# Patient Record
Sex: Female | Born: 1940
Health system: Southern US, Community
[De-identification: ages and names within clinical notes are randomized; demographics above are authoritative.]

## PROBLEM LIST (undated history)

## (undated) DIAGNOSIS — M810 Age-related osteoporosis without current pathological fracture: Secondary | ICD-10-CM

## (undated) DIAGNOSIS — I10 Essential (primary) hypertension: Secondary | ICD-10-CM

## (undated) DIAGNOSIS — J302 Other seasonal allergic rhinitis: Secondary | ICD-10-CM

## (undated) DIAGNOSIS — R002 Palpitations: Secondary | ICD-10-CM

## (undated) DIAGNOSIS — M1990 Unspecified osteoarthritis, unspecified site: Secondary | ICD-10-CM

## (undated) DIAGNOSIS — H269 Unspecified cataract: Secondary | ICD-10-CM

## (undated) DIAGNOSIS — F329 Major depressive disorder, single episode, unspecified: Secondary | ICD-10-CM

## (undated) DIAGNOSIS — E7800 Pure hypercholesterolemia, unspecified: Secondary | ICD-10-CM

## (undated) DIAGNOSIS — F32A Depression, unspecified: Secondary | ICD-10-CM

## (undated) DIAGNOSIS — F419 Anxiety disorder, unspecified: Secondary | ICD-10-CM

## (undated) HISTORY — DX: Palpitations: R00.2

## (undated) HISTORY — PX: BREAST SURGERY: SHX581

## (undated) HISTORY — PX: ABDOMINAL HYSTERECTOMY: SHX81

## (undated) HISTORY — DX: Essential (primary) hypertension: I10

## (undated) HISTORY — DX: Depression, unspecified: F32.A

## (undated) HISTORY — PX: CHOLECYSTECTOMY: SHX55

## (undated) HISTORY — DX: Age-related osteoporosis without current pathological fracture: M81.0

## (undated) HISTORY — DX: Major depressive disorder, single episode, unspecified: F32.9

## (undated) HISTORY — DX: Other seasonal allergic rhinitis: J30.2

## (undated) HISTORY — DX: Unspecified cataract: H26.9

## (undated) HISTORY — DX: Pure hypercholesterolemia, unspecified: E78.00

---

## 1998-01-01 ENCOUNTER — Ambulatory Visit (HOSPITAL_COMMUNITY): Admission: RE | Admit: 1998-01-01 | Discharge: 1998-01-01 | Payer: Self-pay | Admitting: Obstetrics and Gynecology

## 1998-01-08 ENCOUNTER — Ambulatory Visit (HOSPITAL_COMMUNITY): Admission: RE | Admit: 1998-01-08 | Discharge: 1998-01-08 | Payer: Self-pay | Admitting: Obstetrics and Gynecology

## 1998-05-29 ENCOUNTER — Other Ambulatory Visit: Admission: RE | Admit: 1998-05-29 | Discharge: 1998-05-29 | Payer: Self-pay | Admitting: Gastroenterology

## 1998-11-19 ENCOUNTER — Other Ambulatory Visit: Admission: RE | Admit: 1998-11-19 | Discharge: 1998-11-19 | Payer: Self-pay | Admitting: Obstetrics and Gynecology

## 1999-01-12 ENCOUNTER — Encounter: Payer: Self-pay | Admitting: Obstetrics and Gynecology

## 1999-01-12 ENCOUNTER — Ambulatory Visit (HOSPITAL_COMMUNITY): Admission: RE | Admit: 1999-01-12 | Discharge: 1999-01-12 | Payer: Self-pay | Admitting: Obstetrics and Gynecology

## 1999-09-10 ENCOUNTER — Ambulatory Visit (HOSPITAL_COMMUNITY): Admission: RE | Admit: 1999-09-10 | Discharge: 1999-09-10 | Payer: Self-pay | Admitting: Internal Medicine

## 1999-09-10 ENCOUNTER — Encounter: Payer: Self-pay | Admitting: Internal Medicine

## 2000-01-13 ENCOUNTER — Encounter: Payer: Self-pay | Admitting: Internal Medicine

## 2000-01-13 ENCOUNTER — Ambulatory Visit (HOSPITAL_COMMUNITY): Admission: RE | Admit: 2000-01-13 | Discharge: 2000-01-13 | Payer: Self-pay | Admitting: Internal Medicine

## 2000-02-01 ENCOUNTER — Other Ambulatory Visit: Admission: RE | Admit: 2000-02-01 | Discharge: 2000-02-01 | Payer: Self-pay | Admitting: Obstetrics and Gynecology

## 2001-01-13 ENCOUNTER — Encounter: Payer: Self-pay | Admitting: Obstetrics and Gynecology

## 2001-01-13 ENCOUNTER — Ambulatory Visit (HOSPITAL_COMMUNITY): Admission: RE | Admit: 2001-01-13 | Discharge: 2001-01-13 | Payer: Self-pay | Admitting: Obstetrics and Gynecology

## 2001-02-22 ENCOUNTER — Other Ambulatory Visit: Admission: RE | Admit: 2001-02-22 | Discharge: 2001-02-22 | Payer: Self-pay | Admitting: Obstetrics and Gynecology

## 2001-10-18 ENCOUNTER — Encounter: Payer: Self-pay | Admitting: Surgery

## 2001-10-23 ENCOUNTER — Encounter (INDEPENDENT_AMBULATORY_CARE_PROVIDER_SITE_OTHER): Payer: Self-pay

## 2001-10-23 ENCOUNTER — Inpatient Hospital Stay (HOSPITAL_COMMUNITY): Admission: RE | Admit: 2001-10-23 | Discharge: 2001-10-26 | Payer: Self-pay | Admitting: Surgery

## 2002-03-08 ENCOUNTER — Ambulatory Visit (HOSPITAL_COMMUNITY): Admission: RE | Admit: 2002-03-08 | Discharge: 2002-03-08 | Payer: Self-pay | Admitting: Internal Medicine

## 2002-03-08 ENCOUNTER — Encounter: Payer: Self-pay | Admitting: Internal Medicine

## 2003-03-13 ENCOUNTER — Ambulatory Visit (HOSPITAL_COMMUNITY): Admission: RE | Admit: 2003-03-13 | Discharge: 2003-03-13 | Payer: Self-pay | Admitting: Internal Medicine

## 2003-03-21 ENCOUNTER — Encounter: Admission: RE | Admit: 2003-03-21 | Discharge: 2003-03-21 | Payer: Self-pay | Admitting: Internal Medicine

## 2004-03-26 ENCOUNTER — Ambulatory Visit: Payer: Self-pay | Admitting: Internal Medicine

## 2004-04-15 ENCOUNTER — Ambulatory Visit: Payer: Self-pay | Admitting: Internal Medicine

## 2004-04-17 ENCOUNTER — Encounter: Admission: RE | Admit: 2004-04-17 | Discharge: 2004-04-17 | Payer: Self-pay | Admitting: Internal Medicine

## 2004-04-18 ENCOUNTER — Ambulatory Visit: Payer: Self-pay | Admitting: Internal Medicine

## 2004-11-06 ENCOUNTER — Ambulatory Visit: Payer: Self-pay | Admitting: Internal Medicine

## 2005-05-05 ENCOUNTER — Encounter: Admission: RE | Admit: 2005-05-05 | Discharge: 2005-05-05 | Payer: Self-pay | Admitting: Internal Medicine

## 2005-05-20 ENCOUNTER — Ambulatory Visit: Payer: Self-pay | Admitting: Internal Medicine

## 2005-05-24 ENCOUNTER — Ambulatory Visit: Payer: Self-pay | Admitting: Internal Medicine

## 2005-06-10 ENCOUNTER — Ambulatory Visit: Payer: Self-pay | Admitting: Internal Medicine

## 2005-07-01 ENCOUNTER — Ambulatory Visit: Payer: Self-pay | Admitting: Internal Medicine

## 2005-07-27 ENCOUNTER — Ambulatory Visit: Payer: Self-pay | Admitting: Internal Medicine

## 2005-08-03 ENCOUNTER — Ambulatory Visit: Payer: Self-pay | Admitting: Internal Medicine

## 2006-04-29 ENCOUNTER — Ambulatory Visit: Payer: Self-pay | Admitting: Internal Medicine

## 2006-05-11 ENCOUNTER — Encounter: Admission: RE | Admit: 2006-05-11 | Discharge: 2006-05-11 | Payer: Self-pay | Admitting: Internal Medicine

## 2006-05-20 ENCOUNTER — Encounter: Admission: RE | Admit: 2006-05-20 | Discharge: 2006-05-20 | Payer: Self-pay | Admitting: Internal Medicine

## 2006-05-30 ENCOUNTER — Ambulatory Visit: Payer: Self-pay | Admitting: Internal Medicine

## 2006-05-30 LAB — CONVERTED CEMR LAB
ALT: 25 units/L (ref 0–40)
AST: 24 units/L (ref 0–37)
Albumin: 4 g/dL (ref 3.5–5.2)
Alkaline Phosphatase: 75 units/L (ref 39–117)
BUN: 11 mg/dL (ref 6–23)
Bilirubin, Direct: 0.1 mg/dL (ref 0.0–0.3)
Chol/HDL Ratio, serum: 3.2
Cholesterol: 181 mg/dL (ref 0–200)
Creatinine, Ser: 0.7 mg/dL (ref 0.4–1.2)
Glucose, Bld: 108 mg/dL — ABNORMAL HIGH (ref 70–99)
HDL: 56.3 mg/dL (ref >39.0)
LDL Cholesterol: 93 mg/dL (ref 0–99)
Potassium: 4.4 meq/L (ref 3.5–5.1)
TSH: 3 microintl units/mL (ref 0.35–5.50)
Total Bilirubin: 0.8 mg/dL (ref 0.3–1.2)
Total Protein: 7.1 g/dL (ref 6.0–8.3)
Triglyceride fasting, serum: 159 mg/dL — ABNORMAL HIGH (ref 0–149)
VLDL: 32 mg/dL (ref 0–40)

## 2006-06-10 ENCOUNTER — Encounter: Admission: RE | Admit: 2006-06-10 | Discharge: 2006-06-10 | Payer: Self-pay | Admitting: Surgery

## 2006-07-05 ENCOUNTER — Encounter (INDEPENDENT_AMBULATORY_CARE_PROVIDER_SITE_OTHER): Payer: Self-pay | Admitting: *Deleted

## 2006-07-05 ENCOUNTER — Ambulatory Visit (HOSPITAL_BASED_OUTPATIENT_CLINIC_OR_DEPARTMENT_OTHER): Admission: RE | Admit: 2006-07-05 | Discharge: 2006-07-05 | Payer: Self-pay | Admitting: Surgery

## 2006-07-05 ENCOUNTER — Encounter: Admission: RE | Admit: 2006-07-05 | Discharge: 2006-07-05 | Payer: Self-pay | Admitting: Surgery

## 2006-07-18 ENCOUNTER — Inpatient Hospital Stay (HOSPITAL_COMMUNITY): Admission: EM | Admit: 2006-07-18 | Discharge: 2006-07-22 | Payer: Self-pay | Admitting: Emergency Medicine

## 2006-07-18 ENCOUNTER — Ambulatory Visit: Payer: Self-pay | Admitting: Internal Medicine

## 2006-07-21 ENCOUNTER — Encounter (INDEPENDENT_AMBULATORY_CARE_PROVIDER_SITE_OTHER): Payer: Self-pay | Admitting: *Deleted

## 2006-07-21 ENCOUNTER — Ambulatory Visit: Payer: Self-pay | Admitting: Gastroenterology

## 2006-10-11 ENCOUNTER — Ambulatory Visit: Payer: Self-pay | Admitting: Gastroenterology

## 2006-10-20 ENCOUNTER — Encounter: Payer: Self-pay | Admitting: Gastroenterology

## 2006-10-20 ENCOUNTER — Ambulatory Visit: Payer: Self-pay | Admitting: Gastroenterology

## 2007-05-25 ENCOUNTER — Encounter: Admission: RE | Admit: 2007-05-25 | Discharge: 2007-05-25 | Payer: Self-pay | Admitting: Internal Medicine

## 2007-09-05 ENCOUNTER — Encounter: Payer: Self-pay | Admitting: Internal Medicine

## 2007-11-08 ENCOUNTER — Other Ambulatory Visit: Admission: RE | Admit: 2007-11-08 | Discharge: 2007-11-08 | Payer: Self-pay | Admitting: Obstetrics and Gynecology

## 2008-06-03 ENCOUNTER — Encounter: Admission: RE | Admit: 2008-06-03 | Discharge: 2008-06-03 | Payer: Self-pay | Admitting: Internal Medicine

## 2009-06-12 ENCOUNTER — Encounter: Admission: RE | Admit: 2009-06-12 | Discharge: 2009-06-12 | Payer: Self-pay | Admitting: Internal Medicine

## 2009-09-26 ENCOUNTER — Encounter (INDEPENDENT_AMBULATORY_CARE_PROVIDER_SITE_OTHER): Payer: Self-pay | Admitting: *Deleted

## 2009-12-05 ENCOUNTER — Encounter (INDEPENDENT_AMBULATORY_CARE_PROVIDER_SITE_OTHER): Payer: Self-pay | Admitting: *Deleted

## 2009-12-08 ENCOUNTER — Ambulatory Visit: Payer: Self-pay | Admitting: Gastroenterology

## 2009-12-23 ENCOUNTER — Ambulatory Visit: Payer: Self-pay | Admitting: Gastroenterology

## 2010-06-15 ENCOUNTER — Encounter
Admission: RE | Admit: 2010-06-15 | Discharge: 2010-06-15 | Payer: Self-pay | Source: Home / Self Care | Attending: Internal Medicine | Admitting: Internal Medicine

## 2010-06-16 NOTE — Letter (Signed)
Summary: Colonoscopy Letter  [facility]  [address] [place], [place] [address]   Phone: [phone]  Fax: [phone]      Sep 26, 2009 MRN: [id number]   [facility] [address] [place], [place]  [address]   Dear Ms. Stuckey,   According to your medical record, it is time for you to schedule a Colonoscopy. The American Cancer Society recommends this procedure as a method to detect early colon cancer. Patients with a family history of colon cancer, or a personal history of colon polyps or inflammatory bowel disease are at increased risk.  This letter has beeen generated based on the recommendations made at the time of your procedure. If you feel that in your particular situation this may no longer apply, please contact our office.  Please call our office at [phone] to schedule this appointment or to update your records at your earliest convenience.  Thank you for cooperating with [place] to provide you with the very best care possible.   Sincerely,  Judie Petit T. Russella Dar, M.D.  [facility] [phone]

## 2010-06-16 NOTE — Miscellaneous (Signed)
Summary: recall col ...em  Clinical Lists Changes  Allergies: Added new allergy or adverse reaction of SULFA Added new allergy or adverse reaction of * TYLENOL/ADVIL Observations: Added new observation of NKA: F (12/08/2009 10:37)  Appended Document: recall col ...em    Clinical Lists Changes  Medications: Added new medication of MOVIPREP 100 GM  SOLR (PEG-KCL-NACL-NASULF-NA ASC-C) As directed - Signed Rx of MOVIPREP 100 GM  SOLR (PEG-KCL-NACL-NASULF-NA ASC-C) As directed;  #1 x 0;  Signed;  Entered by: Clide Cliff RN;  Authorized by: Meryl Dare MD [facility];  Method used: [facility]  [place] [address], [place], [place]  [address], Ph: [phone] or [phone], Fax: [phone]    Prescriptions: MOVIPREP 100 GM  SOLR (PEG-KCL-NACL-NASULF-NA ASC-C) As directed  #1 x 0   Entered by:   Clide Cliff RN   Authorized by:   Meryl Dare MD [facility]   Signed by:   Clide Cliff RN on 12/08/2009   Method used:   [facility]ron[facility] to        [facility]  [place] [address] (retail)       [address]       [place], [place]  [address]       Ph: [phone] or [phone]       Fax: [phone]   RxID:   [id number]

## 2010-06-16 NOTE — Procedures (Signed)
Summary: Colonoscopy  Patient: Cristina Frederick Note: All result statuses are Final unless otherwise noted.  Tests: (1) Colonoscopy (COL)   COL Colonoscopy           DONE (C)     [facility]     [address]     [place], [place]  [address]           COLONOSCOPY PROCEDURE REPORT           PATIENT:  Cristina, Frederick  MR#:  [id number]     BIRTHDATE:  10-30-1940, 69 yrs. old  GENDER:  female     ENDOSCOPIST:  Judie Petit T. Russella Dar, MD, [facility]           PROCEDURE DATE:  12/23/2009     PROCEDURE:  High risk screening colonoscopy G105     ASA CLASS:  Class II     INDICATIONS:  1) surveillance and high-risk screening  2)     follow-up of polyp, tubulovillous adenoma, 10/2001.     MEDICATIONS:   Fentanyl 75 mcg IV, Versed 8 mg IV     DESCRIPTION OF PROCEDURE:   After the risks benefits and     alternatives of the procedure were thoroughly explained, informed     consent was obtained.  Digital rectal exam was performed and     revealed no abnormalities.   The LB PCF-H180AL [id number] endoscope     was introduced through the anus and advanced to the anastomosis,     without limitations.  The quality of the prep was good, using     MoviPrep.  The instrument was then slowly withdrawn as the colon     was fully examined.     <<PROCEDUREIMAGES>>     FINDINGS:  The right colon was surgically resected and an     ileo-colonic anastamosis was seen.  Moderate diverticulosis was     found in the sigmoid to descending colon. This was otherwise a     normal examination of the colon. Retroflexed views in the rectum     revealed internal hemorrhoids, small.  The time to cecum =  3.33     minutes. The scope was then withdrawn (time =  8.67  min) from the     patient and the procedure completed.           COMPLICATIONS:  None           ENDOSCOPIC IMPRESSION:     1) Prior right hemi-colectomy     2) Moderate diverticulosis in the sigmoid to descending colon     3) Internal hemorrhoids        RECOMMENDATIONS:     1) High fiber diet with liberal fluid intake.     2) Repeat Colonoscopy in 5 years.           Venita Lick. Russella Dar, MD, [facility]           CC: Renford Dills MD           n.     REVISED:  12/23/2009 09:47 AM     eSIGNED:   Judie Petit T. Wetzel Meester at 12/23/2009 09:47 AM           [name], [id number]  Note: An exclamation mark (!) indicates a result that was not dispersed into the flowsheet. Document Creation Date: 12/23/2009 9:47 AM _______________________________________________________________________  (1) Order result status: Final Collection or observation date-time: 12/23/2009 09:34 Requested date-time:  Receipt date-time:  Reported date-time:  Referring Physician:  Ordering Physician: Claudette Head [phone]) Specimen Source:  Source: Launa Grill Order Number: [id number] Lab site:   Appended Document: Colonoscopy    Clinical Lists Changes  Observations: Added new observation of COLONNXTDUE: 12/2014 (12/23/2009 11:15)

## 2010-06-16 NOTE — Letter (Signed)
Summary: [facility] Instructions  [facility]  [address] [place], [place] [address]   Phone: [phone]  Fax: [phone]       Cristina Frederick    1940/06/03    MRN: [id number]        Procedure Day /Date:  12/23/09  Tuesday     Arrival Time:  8:00am     Procedure Time: 9:00am     Location of Procedure:                    _x _  [facility] (4th Floor)                        PREPARATION FOR COLONOSCOPY WITH MOVIPREP   Starting 5 days prior to your procedure _ 8/4/11_ do not eat nuts, seeds, popcorn, corn, beans, peas,  salads, or any raw vegetables.  Do not take any fiber supplements (e.g. Metamucil, Citrucel, and Benefiber).  THE DAY BEFORE YOUR PROCEDURE         DATE:   12/22/09   DAY:  Monday  1.  Drink clear liquids the entire day-NO SOLID FOOD  2.  Do not drink anything colored red or purple.  Avoid juices with pulp.  No orange juice.  3.  Drink at least 64 oz. (8 glasses) of fluid/clear liquids during the day to prevent dehydration and help the prep work efficiently.  CLEAR LIQUIDS INCLUDE: Water Jello Ice Popsicles Tea (sugar ok, no milk/cream) Powdered fruit flavored drinks Coffee (sugar ok, no milk/cream) Gatorade Juice: apple, white grape, white cranberry  Lemonade Clear bullion, consomm, broth Carbonated beverages (any kind) Strained chicken noodle soup Hard Candy                             4.  In the morning, mix first dose of MoviPrep solution:    Empty 1 Pouch A and 1 Pouch B into the disposable container    Add lukewarm drinking water to the top line of the container. Mix to dissolve    Refrigerate (mixed solution should be used within 24 hrs)  5.  Begin drinking the prep at 5:00 p.m. The MoviPrep container is divided by 4 marks.   Every 15 minutes drink the solution down to the next mark (approximately 8 oz) until the full liter is complete.   6.  Follow completed prep with 16 oz of clear liquid of your choice  (Nothing red or purple).  Continue to drink clear liquids until bedtime.  7.  Before going to bed, mix second dose of MoviPrep solution:    Empty 1 Pouch A and 1 Pouch B into the disposable container    Add lukewarm drinking water to the top line of the container. Mix to dissolve    Refrigerate  THE DAY OF YOUR PROCEDURE      DATE:  12/23/09  DAY:  Tuesday  Beginning at 4:00 a.m. (5 hours before procedure):         1. Every 15 minutes, drink the solution down to the next mark (approx 8 oz) until the full liter is complete.  2. Follow completed prep with 16 oz. of clear liquid of your choice.    3. You may drink clear liquids until  7:00am  (2 HOURS BEFORE PROCEDURE).   MEDICATION INSTRUCTIONS  Unless otherwise instructed, you should take regular prescription medications with a small sip of  water   as early as possible the morning of your procedure.    Additional medication instructions:   Do not take your HCTZ the am of your procedure         OTHER INSTRUCTIONS  You will need a responsible adult at least 72 years of age to accompany you and drive you home.   This person must remain in the waiting room during your procedure.  Wear loose fitting clothing that is easily removed.  Leave jewelry and other valuables at home.  However, you may wish to bring a book to read or  an iPod/MP3 player to listen to music as you wait for your procedure to start.  Remove all body piercing jewelry and leave at home.  Total time from sign-in until discharge is approximately 2-3 hours.  You should go home directly after your procedure and rest.  You can resume normal activities the  day after your procedure.  The day of your procedure you should not:   Drive   Make legal decisions   Operate machinery   Drink alcohol   Return to work  You will receive specific instructions about eating, activities and medications before you leave.    The above instructions have been  reviewed and explained to me by   Clide Cliff, RN______________________    I fully understand and can verbalize these instructions _____________________________ Date _________

## 2010-10-02 NOTE — Discharge Summary (Signed)
[facility]  Patient:    Cristina Frederick, Cristina Frederick Visit Number: [id number] MRN: [id number]          Service Type: SUR Location: 3W 4782 01 Attending Physician:  Katha Cabal Dictated by:   Thornton Park Daphine Deutscher, M.D. Admit Date:  10/23/2001 Discharge Date: 10/26/2001   CC:         Stacie Glaze, M.D. [facility] T. Pleas Koch., M.D. [facility]   Discharge Summary  ADMITTING DIAGNOSIS:  Large sessile polyp in the right colon.  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  Appendix - no pathologic diagnosis.  PROCEDURE:  Laparoscopically assisted right hemicolectomy, October 23, 2001, after following a successful colonoscopic tattooing of the lesion.  HOSPITAL COURSE:  The patient is a 70 year old lady who underwent a colonoscopic tattooing of the lesion in the right colon by Dr. Claudette Head on October 23, 2001.  She subsequently went to the operating room on June 9 and had the laparoscopically assisted right hemicolectomy.  Postoperatively, she did very well.  She had a bowel movement on the second postoperative day and was ready for discharge on the third postoperative day at which time she was taking liquids fine and not requiring anything for pain.  She was given a prescription for Mepergan Fortis to take for pain at home if needed.  She will be followed up in the office in three weeks by me but will have her staples taken out in the office next Monday.  Condition good.  FINAL DIAGNOSIS:  Tubulovillous adenoma of the ascending colon status post laparoscopically assisted right hemicolectomy. Dictated by:   Thornton Park Daphine Deutscher, M.D. Attending Physician:  Katha Cabal DD:  10/26/01 TD:  10/28/01 Job: [id number]

## 2010-10-02 NOTE — Op Note (Signed)
NAMESAFFRON, BUSEY                ACCOUNT NO.:  [id number]   MEDICAL RECORD NO.:  [id number]          PATIENT TYPE:  AMB   LOCATION:  DSC                          FACILITY:  [facility]   PHYSICIAN:  Thornton Park. Daphine Deutscher, MD  DATE OF BIRTH:  [birth year]   DATE OF PROCEDURE:  07/05/2006  DATE OF DISCHARGE:                               OPERATIVE REPORT   PREOPERATIVE DIAGNOSIS:  Radial scar, left breast.   POSTOPERATIVE DIAGNOSIS:  Radial scar, left breast, status post needle  localized left breast biopsy.   SURGEON:  Thornton Park. Daphine Deutscher, MD   ANESTHESIA:  General by LMA.   DESCRIPTION OF PROCEDURE:  Cristina Frederick is a 70 year old lady who has  developed a new radial scar appearing lesion on her annual mammogram.  This area was localized by Dr. Deboraha Sprang and she was brought to OR 3 at [facility]  Day surgery.  The breast was prepped with Betadine and draped sterilely.  Curvilinear incision was made incorporating the wire which was then  fixed in the tissue with a 4-0 Vicryl.  I then cut around this and went  deep down to the wire which was down to the chest wall.  I guided myself  with palpation and any kind of thickened tissue I went around including  the specimen.  It was sent off to Dr. Deboraha Sprang who confirmed the presence  over at the breast center of the area in question and then I had  meantime injected the area with some lidocaine, Marcaine and controlled  bleeding with electrocautery and approximated some of the breast tissue  with 4-0 Vicryls before closing the skin with subcuticular 4-0 Vicryls  with Dermabond.  The patient tolerated procedure well and was taken to  recovery room in satisfactory condition.  She is given Tylox to take for  pain and will followed up in the office.      Thornton Park Daphine Deutscher, MD  Electronically Signed     MBM/MEDQ  D:  07/05/2006  T:  07/05/2006  Job:  [id number]   cc:   Stacie Glaze, MD  Daryl Eastern, M.D.

## 2010-10-02 NOTE — Op Note (Signed)
[facility]  Patient:    Cristina Frederick, Cristina Frederick Visit Number: [id number] MRN: [id number]          Service Type: SUR Location: 3W 3875 01 Attending Physician:  Katha Cabal Dictated by:   Thornton Park Daphine Deutscher, M.D. Proc. Date: 10/23/01 Admit Date:  10/23/2001   CC:         Stacie Glaze, M.D. [facility] T. Pleas Koch., M.D. [facility]   Operative Report  PREOPERATIVE DIAGNOSIS:  Large sessile polyp in the ascending colon.  POSTOPERATIVE DIAGNOSIS:  Large sessile polyp in the ascending colon, pathology pending.  PROCEDURE:  Laparoscopically-assisted right hemicolectomy.  SURGEON:  Thornton Park. Daphine Deutscher, M.D.  ASSISTANT:  Zigmund Daniel, M.D.  ANESTHESIA:  General endotracheal.  DESCRIPTION OF PROCEDURE:  The patient is a 70 year old lady who was evaluated in our office on Oct 12, 2001, regarding the above mentioned 3 cm sessile polyp.  We discussed the rationale for right hemicolectomy and felt like she would be a good candidate to have this laparoscopically-assisted procedure. Arrangements were made for Dr. Russella Dar to colonoscope her preoperatively to tattoo the area.  After successful colonoscopic localization and tattooing, the patient was brought to OR #1 and given general anesthesia.  The abdomen was prepped widely with Betadine and draped sterilely.  First, I made a transverse incision just above the umbilicus and through a pursestring suture, I placed the Hasson cannula, and inflated the abdomen using the 10 mm port.  The 10 mm 45 degree scope was inserted and then I placed three other 5 mm ports in the right lower quadrant, left lower quadrant, and left upper quadrant.  Using Glassman retractors and the harmonic scalpel, we then proceeded to mobilize the right colon from the appendix up the ascending colon, and bring down the hepatic flexure.  I was able to mobilize this and visualize the tattoo in the midportion of the ascending colon.  I  felt like this was mobile enough.  I then allowed insufflation to be maintained.  I made a small 7 cm transverse incision including the Hasson incision and then opened the right rectus partially.  This partial transection of the muscle and placement of the hand assist retractor for the Pneumo Sleeve by dexterity, providing an ideal wound protector as well as a retractor.  With this in place, I was then able to grasp the ascending colon and deliver all of this into the wound.  I divided the terminal ileum near the ileocecal valve after taking down the bloodless fold of Treves.  I then after dividing the ileum, I divided the proximal transverse colon which allowed for an adequate margin.  I then scored the mesentery and went through this with Kelly clamps, oversewing the mesentery with suture ligatures of 3-0 Vicryl.  Where the vessels are, these were oversewed as well as double tied.  No bleeding from the mesentery was noted.  The antimesenteric border of the small intestine was aligned along the tenia.  The tip of the ileum was cut and then the tenia was Bovied and I passed the 7 cm GIA and fired it creating an anastomosis.  The common defect was inspected and there was no bleeding from the staple lines.  It was then closed by placing Allis clamps on either ends at the staple line junctions, and in the midportion a suture was placed.  A good stapled staple line was placed which sealed the anastomosis.  The mesentery was closed with interrupted 3-0 Vicryls  and a cross-stitch of 3-0 Vicryl was placed.  No bleeding was noted, and then allowed to return into the abdomen.  The wound retractor was removed and gloves were changed.  The posterior sheath was closed with a running #1 PDS, and crossing up to the middle, and tied to itself.  A second layer after irrigation was closed with running #1 PDS.  The wound was irrigated with 0.5% Marcaine as were the three other trocar sites.  I then went  back in after insufflating the abdomen and inspected the transverse closure.  I also inspected and aspirated the right upper quadrant and no bleeding was noted.  Port sites were again injected. The abdomen was deflated and the skin was closed with staples.  The patient seemed to tolerate the procedure well and was taken to the recovery room in satisfactory condition. Dictated by:   Thornton Park Daphine Deutscher, M.D. Attending Physician:  Katha Cabal DD:  10/23/01 TD:  10/25/01 Job: [id number]

## 2010-10-02 NOTE — H&P (Signed)
NAMENEILA, TEEM                ACCOUNT NO.:  [id number]   MEDICAL RECORD NO.:  [id number]          PATIENT TYPE:  INP   LOCATION:  1618                         FACILITY[facility]  [facility]   PHYSICIAN:  Velora Heckler, MD      DATE OF BIRTH:  Jan 04, 1941   DATE OF ADMISSION:  07/18/2006  DATE OF DISCHARGE:                              HISTORY & PHYSICAL   PRIMARY PHYSICIAN:  Stacie Glaze, MD, [facility].   CHIEF COMPLAINT:  Abdominal pain, nausea, vomiting.   HISTORY OF PRESENT ILLNESS:  Cristina Frederick is a 70 year old white female  from [place], [place].  She presents to the emergency  department accompanied by her oldest daughter with a 48-hour history of  abdominal pain in the epigastrium.  This was followed by onset of  nausea, vomiting and chills.  The patient had persistent pain which has  become more severe.  The patient and her daughter note dark urine.  She  denies jaundice.  She denies fever.  The patient was seen and evaluated  in the emergency department.  White blood cell count was markedly  elevated at 29,000 with left shift to 95% segmented neutrophils.  Liver  function test showed mildly elevated transaminases.  A CT scan abdomen  and pelvis was obtained which showed findings consistent with acute  cholecystitis and common bile duct and intrahepatic bile duct  dilatation.  General surgery is now called for management.   PAST MEDICAL HISTORY:  1. History of diverticulitis.  2. History of hypertension.  3. History of hypercholesterolemia.  4. Status post partial colectomy for polyps by Dr. Luretha Murphy,      status post left breast biopsy 2 weeks ago by Dr. Luretha Murphy,      status post TAH and BSO.   FAMILY HISTORY:  Unremarkable.   SOCIAL HISTORY:  The patient is widowed.  She has four children.  She is  accompanied by her oldest daughter.  She does not smoke.  She drinks  wine in moderation.   MEDICATIONS:  Cipro, Flagyl Phenergan all  started yesterday by Dr.  Lovell Sheehan.  Normal daily medications include:  Lotrel hydrochlorothiazide,  aspirin, and Zetia.   ALLERGIES:  To NONSTEROIDAL ANTI-INFLAMMATORIES including IBUPROFEN,  SULFA, and TYLENOL causing hives and swelling.   REVIEW OF SYSTEMS:  A 15-system review without significant other  findings.   PHYSICAL EXAMINATION:  GENERAL:  70 year old ill-appearing white female  on a stretcher in the emergency department.  Temperature 99.5, pulse 80,  respirations 18, blood pressure 138/73, O2 saturation 98% on room air.  HEENT: Shows her to be normocephalic.  Sclerae are clear.  Dentition  fair.  Mucous membranes moist.  Voice normal.  NECK:  Palpation of the neck shows no thyroid nodules.  No  lymphadenopathy.  No tenderness.  LUNGS:  Clear to auscultation bilaterally without rales, rhonchi or  wheeze.  CARDIAC:  Exam shows regular rate and rhythm without murmur.  Peripheral  pulses are full.  BREASTS:  Left breast shows a healing surgical wound in the upper outer  quadrant which is clear  and dry.  ABDOMEN:  Soft.  A few bowel sounds are present on auscultation.  Surgical wounds appear well healed.  There is no obvious sign of  herniation.  Palpation reveals tenderness in the epigastrium and right  upper quadrant.  With deep inspiration, there is clearly a Murphy's sign  present.  There is no palpable mass.  EXTREMITIES:  Nontender without edema.  NEUROLOGICALLY:  The patient is alert and oriented without focal  deficit.   LABORATORY STUDIES:  White count 29.7, hemoglobin 15.2, platelet count  233,000.  Differential shows 95% segmented neutrophils.  Electrolytes  are normal with the exception of a potassium of 3.2.  Liver function  tests show elevated transaminases with an SGOT of 113, SGPT 135,  bilirubin normal at 1.2, alkaline phosphatase is normal at 96.   RADIOGRAPHIC STUDIES:  CT scan of the abdomen and pelvis showing  findings consistent with an acute  cholecystitis.  There is common bile  duct dilatation to nearly 12 mm.  There is also intrapelvic biliary  dilatation.   IMPRESSION:  1. Acute cholangitis.  2. Acute cholecystitis.  3. History of hypertension   PLAN:  1. The patient will be admitted to [facility]      under the general surgery service.  2. Institution of intravenous antibiotics immediately.  3. NPO, IV fluids, ice chips for comfort  4. Abdominal ultrasound in a.m. 07/19/2006 to assess for      cholelithiasis and possible distal common bile duct obstruction.  5. Probable consultation with gastroenterology for possible need for      nasobiliary drainage or cholangitis.  6. Cholecystectomy during this hospitalization.   I discussed this with the patient and her daughter.  They understand the  plan.      Velora Heckler, MD  Electronically Signed     TMG/MEDQ  D:  07/18/2006  T:  07/19/2006  Job:  [id number]   cc:   Thornton Park Daphine Deutscher, MD  [address]  [place]  [place] [address]   Stacie Glaze, MD  [address] [place]  [place] [address]

## 2010-10-02 NOTE — Op Note (Signed)
Cristina Frederick, Cristina Frederick                ACCOUNT NO.:  [id number]   MEDICAL RECORD NO.:  [id number]          PATIENT TYPE:  INP   LOCATION:  1618                         FACILITY:  [facility]   PHYSICIAN:  Velora Heckler, MD      DATE OF BIRTH:  02-May-1941   DATE OF PROCEDURE:  07/21/2006  DATE OF DISCHARGE:                               OPERATIVE REPORT   PREOPERATIVE DIAGNOSIS:  Acute cholecystitis, cholelithiasis   POSTOPERATIVE DIAGNOSIS:  Acute cholecystitis, cholelithiasis.   PROCEDURE:  Laparoscopic cholecystectomy.   SURGEON:  Velora Heckler, MD, FACS   ASSISTANT:  Glenna Fellows, MD, FACS   ANESTHESIA:  General per Dr. Lucille Passy.   ESTIMATED BLOOD LOSS:  150 mL.   PREPARATION:  Betadine.   COMPLICATIONS:  None.   INDICATIONS:  The patient is a 70 year old white female admitted from  the emergency department on March 3rd with acute cholangitis and  cholecystitis.  The patient is admitted on the surgical service and  started on intravenous antibiotics.  She underwent radiographic studies.  She is noted to have a dilated biliary system.  Liver function tests  continued to rise after admission.  Gastroenterology is consulted and  Dr. Claudette Head took the patient for a ERCP on March4,2008.  The  patient underwent sphincterotomy.  She remained on antibiotics.  She had  marked improvement.  She is prepared and now brought to the operating  room for cholecystectomy.   Procedure is done in OR #1 at the [facility].  The  patient is brought to the operating room, placed in a supine position on  the operating room table.  Following administration of general  anesthesia, the patient is prepped and draped in the usual strict  aseptic fashion.  After ascertaining that an adequate level of  anesthesia had been obtained, an infraumbilical incision is made with a  #15 blade.  Dissection is carried through subcutaneous tissues.  Fascia  is incised in the  midline.  The peritoneal cavity is entered cautiously.  Vicryl 0 pursestring suture is placed in the fascia.  An Hasson cannula  is introduced and secured with the pursestring suture.  Abdomen is  insufflated with carbon dioxide.  Laparoscope is introduced and the  abdomen explored.  There are adhesions to the patient's right transverse  abdominal incision.  However, the scope is easily manipulated around  these into the right upper quadrant.  Operative port is placed in the  subxiphoid position.  Scope is moved to the subxiphoid position and 2  further 5 mm ports are placed in the right subcostal position.  Gallbladder is exposed.  The omentum is densely adherent and is gently  dissected off with blunt dissection.  Gallbladder is markedly distended.  It is punctured with an aspirating trocar and evacuated.  It contains  dark black bile.  Gallbladder is grasped and retracted cephalad.  Omentum is gently peeled off of the markedly inflamed and thickened  gallbladder.  Tissues are quite friable.  Dissection is carried down to  the neck of the gallbladder.  Cystic duct is dissected  out, doubly  clipped, and divided.  Cystic artery is dissected out, doubly clipped,  and divided.  Posterior branch of the cystic artery is doubly clipped  and divided.  Using the hook electrocautery, the gallbladder is excised  from the gallbladder bed with great difficulty.  There is no significant  plane between the gallbladder wall and the liver parenchyma.  Gallbladder is finally dissected free of the liver completely and placed  into an EndoCatch bag.  Gallbladder bed is inspected and there is  moderate venous bleeding.  This is controlled with cautery followed by  application of FloSeal and Surgicel.  A 19 Blake drain is brought into  the peritoneal cavity and exited through the lateral right stab wound.  It is placed beneath the edge of the liver in the gallbladder bed.  Drain is secured to the skin with a  3-0 nylon suture.  Gallbladder is  retrieved and brought out through the umbilical port after opening the  gallbladder and removing the large stone which is impacted in the  gallbladder neck.  Right upper quadrant is irrigated with warm saline  which is evacuated.  Good hemostasis is noted.  Pneumoperitoneum is  released.  Ports are removed under direct vision and good hemostasis is  noted at all port sites.  Pneumoperitoneum is evacuated.  Vicryl 0  pursestring suture is tied securely.  Port sites are anesthetized with  local anesthetic.  All wounds are closed with interrupted 4-0 Vicryl  subcuticular sutures.  Wounds are washed and dried and Benzoin Steri-  Strips are applied.  Sterile dressings are applied.  The patient is  awakened from anesthesia and brought to the recovery room in stable  condition.  The patient tolerated the procedure well.      Velora Heckler, MD  Electronically Signed     TMG/MEDQ  D:  07/21/2006  T:  07/21/2006  Job:  [id number]   cc:   Stacie Glaze, MD  [address] [place]  [place] [address]   Venita Lick. Russella Dar, MD, FACG  [address]  [place]  [place] [address]

## 2010-10-02 NOTE — Discharge Summary (Signed)
NAMESHATERIA, PATERNOSTRO                ACCOUNT NO.:  [id number]   MEDICAL RECORD NO.:  [id number]          PATIENT TYPE:  INP   LOCATION:  1618                         [facility]ACILITY:  [facility]   PHYSICIAN:  Velora Heckler, MD      DATE OF BIRTH:  11-23-1940   DATE OF ADMISSION:  07/18/2006  DATE OF DISCHARGE:  07/22/2006                               DISCHARGE SUMMARY   REASON FOR ADMISSION:  Abdominal pain, nausea, and vomiting.   HISTORY OF PRESENT ILLNESS:  The patient is a 70 year old white female  from [place], [place].  She presented to the emergency  department with abdominal pain, nausea, and vomiting.  White blood cell  count was elevated at 29,000 with left shift.  Liver function tests  showed elevated transaminases.  CT scan of abdomen and pelvis showed  findings consistent with acute cholecystitis and a common biliary duct  and intrahepatic bile duct dilatation.  The patient was seen and  evaluated in the emergency department and admitted on the general  surgical service.   HOSPITAL COURSE:  The patient was started on intravenous antibiotics for  acute cholangitis and acute cholecystitis.  The patient was seen in  consultation by gastroenterology, Dr. Claudette Head.  The patient was  prepared and underwent ERCP with stone extraction and stent placement.  The patient showed gradual improvement, was prepared, and ultimately  taken to the operating room on July 21, 2006.  She underwent  laparoscopic cholecystectomy.  Postoperatively, she did well.  She  started on a clear liquid diet and advanced to a regular diet.  A  Al Pimple drain was left in place postoperatively.  She was prepared  for discharge home on the first postoperative day.   DISCHARGE PLANNING:  The patient is discharged home July 22, 2006, in  good condition, tolerating a regular diet, and ambulating independently.   DISCHARGE MEDICATIONS:  Include Vicodin for pain and Augmentin 875 mg  b.i.d. for one  week.  The patient will be seen back in my office at  [facility] in three days for wound check and drain  removal.   FINAL DIAGNOSES:  Acute cholecystitis, cholelithiasis, acute  cholangitis, choledocholithiasis.   CONDITION ON DISCHARGE:  Improved.      Velora Heckler, MD  Electronically Signed     TMG/MEDQ  D:  08/18/2006  T:  08/18/2006  Job:  [id number]   cc:   Venita Lick. Russella Dar, MD, FACG  [address]  [place]  [place] [address]   Stacie Glaze, MD  [address] [place]  [place] [address]

## 2011-05-26 ENCOUNTER — Other Ambulatory Visit: Payer: Self-pay | Admitting: Internal Medicine

## 2011-05-26 DIAGNOSIS — Z1231 Encounter for screening mammogram for malignant neoplasm of breast: Secondary | ICD-10-CM

## 2011-06-18 ENCOUNTER — Ambulatory Visit: Payer: Self-pay

## 2011-06-26 DIAGNOSIS — J019 Acute sinusitis, unspecified: Secondary | ICD-10-CM | POA: Diagnosis not present

## 2011-07-09 ENCOUNTER — Ambulatory Visit
Admission: RE | Admit: 2011-07-09 | Discharge: 2011-07-09 | Disposition: A | Discharge status: Home / Self Care | Payer: Medicare Other | Source: Ambulatory Visit | Attending: Internal Medicine | Admitting: Internal Medicine

## 2011-07-09 DIAGNOSIS — Z1231 Encounter for screening mammogram for malignant neoplasm of breast: Secondary | ICD-10-CM

## 2011-07-27 DIAGNOSIS — R000 Tachycardia, unspecified: Secondary | ICD-10-CM | POA: Diagnosis not present

## 2011-07-27 DIAGNOSIS — R002 Palpitations: Secondary | ICD-10-CM | POA: Diagnosis not present

## 2011-07-29 DIAGNOSIS — R002 Palpitations: Secondary | ICD-10-CM | POA: Diagnosis not present

## 2011-07-29 DIAGNOSIS — I10 Essential (primary) hypertension: Secondary | ICD-10-CM | POA: Diagnosis not present

## 2011-07-29 DIAGNOSIS — R079 Chest pain, unspecified: Secondary | ICD-10-CM | POA: Diagnosis not present

## 2011-08-02 DIAGNOSIS — R002 Palpitations: Secondary | ICD-10-CM | POA: Diagnosis not present

## 2011-08-03 DIAGNOSIS — R002 Palpitations: Secondary | ICD-10-CM | POA: Diagnosis not present

## 2011-08-11 DIAGNOSIS — E782 Mixed hyperlipidemia: Secondary | ICD-10-CM | POA: Diagnosis not present

## 2011-08-11 DIAGNOSIS — I10 Essential (primary) hypertension: Secondary | ICD-10-CM | POA: Diagnosis not present

## 2011-08-11 DIAGNOSIS — R079 Chest pain, unspecified: Secondary | ICD-10-CM | POA: Diagnosis not present

## 2011-08-11 DIAGNOSIS — R002 Palpitations: Secondary | ICD-10-CM | POA: Diagnosis not present

## 2011-08-26 DIAGNOSIS — I10 Essential (primary) hypertension: Secondary | ICD-10-CM | POA: Diagnosis not present

## 2011-08-31 DIAGNOSIS — I472 Ventricular tachycardia: Secondary | ICD-10-CM | POA: Diagnosis not present

## 2011-08-31 DIAGNOSIS — I10 Essential (primary) hypertension: Secondary | ICD-10-CM | POA: Diagnosis not present

## 2011-09-23 DIAGNOSIS — R002 Palpitations: Secondary | ICD-10-CM | POA: Diagnosis not present

## 2011-09-23 DIAGNOSIS — F329 Major depressive disorder, single episode, unspecified: Secondary | ICD-10-CM | POA: Diagnosis not present

## 2011-09-23 DIAGNOSIS — E782 Mixed hyperlipidemia: Secondary | ICD-10-CM | POA: Diagnosis not present

## 2011-09-23 DIAGNOSIS — I10 Essential (primary) hypertension: Secondary | ICD-10-CM | POA: Diagnosis not present

## 2011-09-23 DIAGNOSIS — F411 Generalized anxiety disorder: Secondary | ICD-10-CM | POA: Diagnosis not present

## 2011-10-06 DIAGNOSIS — L570 Actinic keratosis: Secondary | ICD-10-CM | POA: Diagnosis not present

## 2011-10-06 DIAGNOSIS — L821 Other seborrheic keratosis: Secondary | ICD-10-CM | POA: Diagnosis not present

## 2011-10-06 DIAGNOSIS — D485 Neoplasm of uncertain behavior of skin: Secondary | ICD-10-CM | POA: Diagnosis not present

## 2011-10-06 DIAGNOSIS — L439 Lichen planus, unspecified: Secondary | ICD-10-CM | POA: Diagnosis not present

## 2011-10-06 DIAGNOSIS — Z85828 Personal history of other malignant neoplasm of skin: Secondary | ICD-10-CM | POA: Diagnosis not present

## 2011-10-12 DIAGNOSIS — L570 Actinic keratosis: Secondary | ICD-10-CM | POA: Diagnosis not present

## 2011-10-12 DIAGNOSIS — D485 Neoplasm of uncertain behavior of skin: Secondary | ICD-10-CM | POA: Diagnosis not present

## 2011-10-12 DIAGNOSIS — D2370 Other benign neoplasm of skin of unspecified lower limb, including hip: Secondary | ICD-10-CM | POA: Diagnosis not present

## 2011-10-12 DIAGNOSIS — Z411 Encounter for cosmetic surgery: Secondary | ICD-10-CM | POA: Diagnosis not present

## 2011-10-12 DIAGNOSIS — L821 Other seborrheic keratosis: Secondary | ICD-10-CM | POA: Diagnosis not present

## 2012-01-19 DIAGNOSIS — H2510 Age-related nuclear cataract, unspecified eye: Secondary | ICD-10-CM | POA: Diagnosis not present

## 2012-02-08 DIAGNOSIS — Z23 Encounter for immunization: Secondary | ICD-10-CM | POA: Diagnosis not present

## 2012-02-09 DIAGNOSIS — L570 Actinic keratosis: Secondary | ICD-10-CM | POA: Diagnosis not present

## 2012-02-09 DIAGNOSIS — Z411 Encounter for cosmetic surgery: Secondary | ICD-10-CM | POA: Diagnosis not present

## 2012-03-30 DIAGNOSIS — E782 Mixed hyperlipidemia: Secondary | ICD-10-CM | POA: Diagnosis not present

## 2012-03-30 DIAGNOSIS — I10 Essential (primary) hypertension: Secondary | ICD-10-CM | POA: Diagnosis not present

## 2012-03-30 DIAGNOSIS — F411 Generalized anxiety disorder: Secondary | ICD-10-CM | POA: Diagnosis not present

## 2012-03-30 DIAGNOSIS — M810 Age-related osteoporosis without current pathological fracture: Secondary | ICD-10-CM | POA: Diagnosis not present

## 2012-03-30 DIAGNOSIS — Z0000 Encounter for general adult medical examination without abnormal findings: Secondary | ICD-10-CM | POA: Diagnosis not present

## 2012-03-30 DIAGNOSIS — M436 Torticollis: Secondary | ICD-10-CM | POA: Diagnosis not present

## 2012-04-27 DIAGNOSIS — E782 Mixed hyperlipidemia: Secondary | ICD-10-CM | POA: Diagnosis not present

## 2012-04-27 DIAGNOSIS — I10 Essential (primary) hypertension: Secondary | ICD-10-CM | POA: Diagnosis not present

## 2012-05-31 DIAGNOSIS — M810 Age-related osteoporosis without current pathological fracture: Secondary | ICD-10-CM | POA: Diagnosis not present

## 2012-07-03 ENCOUNTER — Other Ambulatory Visit: Payer: Self-pay | Admitting: Internal Medicine

## 2012-07-03 DIAGNOSIS — Z1231 Encounter for screening mammogram for malignant neoplasm of breast: Secondary | ICD-10-CM

## 2012-08-02 ENCOUNTER — Ambulatory Visit
Admission: RE | Admit: 2012-08-02 | Discharge: 2012-08-02 | Disposition: A | Discharge status: Home / Self Care | Payer: Medicare Other | Source: Ambulatory Visit | Attending: Internal Medicine | Admitting: Internal Medicine

## 2012-08-02 DIAGNOSIS — Z1231 Encounter for screening mammogram for malignant neoplasm of breast: Secondary | ICD-10-CM

## 2012-09-21 DIAGNOSIS — I10 Essential (primary) hypertension: Secondary | ICD-10-CM | POA: Diagnosis not present

## 2012-09-21 DIAGNOSIS — F411 Generalized anxiety disorder: Secondary | ICD-10-CM | POA: Diagnosis not present

## 2012-09-21 DIAGNOSIS — E782 Mixed hyperlipidemia: Secondary | ICD-10-CM | POA: Diagnosis not present

## 2012-09-21 DIAGNOSIS — F329 Major depressive disorder, single episode, unspecified: Secondary | ICD-10-CM | POA: Diagnosis not present

## 2012-09-21 DIAGNOSIS — M810 Age-related osteoporosis without current pathological fracture: Secondary | ICD-10-CM | POA: Diagnosis not present

## 2012-10-17 ENCOUNTER — Other Ambulatory Visit: Payer: Self-pay | Admitting: Dermatology

## 2012-10-17 DIAGNOSIS — D239 Other benign neoplasm of skin, unspecified: Secondary | ICD-10-CM | POA: Diagnosis not present

## 2012-10-17 DIAGNOSIS — Z411 Encounter for cosmetic surgery: Secondary | ICD-10-CM | POA: Diagnosis not present

## 2012-10-17 DIAGNOSIS — Z85828 Personal history of other malignant neoplasm of skin: Secondary | ICD-10-CM | POA: Diagnosis not present

## 2012-10-17 DIAGNOSIS — C44319 Basal cell carcinoma of skin of other parts of face: Secondary | ICD-10-CM | POA: Diagnosis not present

## 2012-10-17 DIAGNOSIS — D485 Neoplasm of uncertain behavior of skin: Secondary | ICD-10-CM | POA: Diagnosis not present

## 2012-10-17 DIAGNOSIS — L821 Other seborrheic keratosis: Secondary | ICD-10-CM | POA: Diagnosis not present

## 2012-12-12 DIAGNOSIS — C44319 Basal cell carcinoma of skin of other parts of face: Secondary | ICD-10-CM | POA: Diagnosis not present

## 2012-12-12 DIAGNOSIS — Z85828 Personal history of other malignant neoplasm of skin: Secondary | ICD-10-CM | POA: Diagnosis not present

## 2012-12-28 ENCOUNTER — Other Ambulatory Visit: Payer: Self-pay | Admitting: Dermatology

## 2012-12-28 DIAGNOSIS — D485 Neoplasm of uncertain behavior of skin: Secondary | ICD-10-CM | POA: Diagnosis not present

## 2012-12-28 DIAGNOSIS — C44319 Basal cell carcinoma of skin of other parts of face: Secondary | ICD-10-CM | POA: Diagnosis not present

## 2013-01-30 DIAGNOSIS — C44319 Basal cell carcinoma of skin of other parts of face: Secondary | ICD-10-CM | POA: Diagnosis not present

## 2013-02-06 DIAGNOSIS — C44319 Basal cell carcinoma of skin of other parts of face: Secondary | ICD-10-CM | POA: Diagnosis not present

## 2013-02-16 DIAGNOSIS — Z23 Encounter for immunization: Secondary | ICD-10-CM | POA: Diagnosis not present

## 2013-04-10 DIAGNOSIS — F329 Major depressive disorder, single episode, unspecified: Secondary | ICD-10-CM | POA: Diagnosis not present

## 2013-04-10 DIAGNOSIS — Z0000 Encounter for general adult medical examination without abnormal findings: Secondary | ICD-10-CM | POA: Diagnosis not present

## 2013-04-10 DIAGNOSIS — F411 Generalized anxiety disorder: Secondary | ICD-10-CM | POA: Diagnosis not present

## 2013-04-10 DIAGNOSIS — I10 Essential (primary) hypertension: Secondary | ICD-10-CM | POA: Diagnosis not present

## 2013-04-10 DIAGNOSIS — M810 Age-related osteoporosis without current pathological fracture: Secondary | ICD-10-CM | POA: Diagnosis not present

## 2013-04-10 DIAGNOSIS — R002 Palpitations: Secondary | ICD-10-CM | POA: Diagnosis not present

## 2013-05-21 DIAGNOSIS — R002 Palpitations: Secondary | ICD-10-CM | POA: Diagnosis not present

## 2013-07-19 ENCOUNTER — Other Ambulatory Visit: Payer: Self-pay

## 2013-07-19 DIAGNOSIS — Z1231 Encounter for screening mammogram for malignant neoplasm of breast: Secondary | ICD-10-CM

## 2013-08-09 ENCOUNTER — Ambulatory Visit
Admission: RE | Admit: 2013-08-09 | Discharge: 2013-08-09 | Disposition: A | Discharge status: Home / Self Care | Payer: Medicare Other | Source: Ambulatory Visit

## 2013-08-09 DIAGNOSIS — Z1231 Encounter for screening mammogram for malignant neoplasm of breast: Secondary | ICD-10-CM

## 2013-09-03 DIAGNOSIS — H2510 Age-related nuclear cataract, unspecified eye: Secondary | ICD-10-CM | POA: Diagnosis not present

## 2013-09-03 DIAGNOSIS — H43399 Other vitreous opacities, unspecified eye: Secondary | ICD-10-CM | POA: Diagnosis not present

## 2013-10-10 DIAGNOSIS — F329 Major depressive disorder, single episode, unspecified: Secondary | ICD-10-CM | POA: Diagnosis not present

## 2013-10-10 DIAGNOSIS — E782 Mixed hyperlipidemia: Secondary | ICD-10-CM | POA: Diagnosis not present

## 2013-10-10 DIAGNOSIS — I10 Essential (primary) hypertension: Secondary | ICD-10-CM | POA: Diagnosis not present

## 2013-10-10 DIAGNOSIS — M810 Age-related osteoporosis without current pathological fracture: Secondary | ICD-10-CM | POA: Diagnosis not present

## 2013-10-10 DIAGNOSIS — F3289 Other specified depressive episodes: Secondary | ICD-10-CM | POA: Diagnosis not present

## 2013-10-23 DIAGNOSIS — D239 Other benign neoplasm of skin, unspecified: Secondary | ICD-10-CM | POA: Diagnosis not present

## 2013-10-23 DIAGNOSIS — Z85828 Personal history of other malignant neoplasm of skin: Secondary | ICD-10-CM | POA: Diagnosis not present

## 2013-10-23 DIAGNOSIS — L821 Other seborrheic keratosis: Secondary | ICD-10-CM | POA: Diagnosis not present

## 2013-10-23 DIAGNOSIS — Z411 Encounter for cosmetic surgery: Secondary | ICD-10-CM | POA: Diagnosis not present

## 2014-01-29 ENCOUNTER — Other Ambulatory Visit: Payer: Self-pay | Admitting: Dermatology

## 2014-01-29 DIAGNOSIS — L570 Actinic keratosis: Secondary | ICD-10-CM | POA: Diagnosis not present

## 2014-01-29 DIAGNOSIS — D485 Neoplasm of uncertain behavior of skin: Secondary | ICD-10-CM | POA: Diagnosis not present

## 2014-01-29 DIAGNOSIS — L708 Other acne: Secondary | ICD-10-CM | POA: Diagnosis not present

## 2014-02-13 DIAGNOSIS — Z23 Encounter for immunization: Secondary | ICD-10-CM | POA: Diagnosis not present

## 2014-04-16 DIAGNOSIS — F329 Major depressive disorder, single episode, unspecified: Secondary | ICD-10-CM | POA: Diagnosis not present

## 2014-04-16 DIAGNOSIS — J309 Allergic rhinitis, unspecified: Secondary | ICD-10-CM | POA: Diagnosis not present

## 2014-04-16 DIAGNOSIS — Z1389 Encounter for screening for other disorder: Secondary | ICD-10-CM | POA: Diagnosis not present

## 2014-04-16 DIAGNOSIS — M810 Age-related osteoporosis without current pathological fracture: Secondary | ICD-10-CM | POA: Diagnosis not present

## 2014-04-16 DIAGNOSIS — I10 Essential (primary) hypertension: Secondary | ICD-10-CM | POA: Diagnosis not present

## 2014-04-16 DIAGNOSIS — E782 Mixed hyperlipidemia: Secondary | ICD-10-CM | POA: Diagnosis not present

## 2014-04-16 DIAGNOSIS — Z0000 Encounter for general adult medical examination without abnormal findings: Secondary | ICD-10-CM | POA: Diagnosis not present

## 2014-05-09 DIAGNOSIS — Z1211 Encounter for screening for malignant neoplasm of colon: Secondary | ICD-10-CM | POA: Diagnosis not present

## 2014-06-24 ENCOUNTER — Ambulatory Visit
Admission: RE | Admit: 2014-06-24 | Discharge: 2014-06-24 | Disposition: A | Discharge status: Home / Self Care | Payer: Medicare Other | Source: Ambulatory Visit | Attending: Internal Medicine | Admitting: Internal Medicine

## 2014-06-24 ENCOUNTER — Other Ambulatory Visit: Payer: Self-pay | Admitting: Internal Medicine

## 2014-06-24 DIAGNOSIS — M810 Age-related osteoporosis without current pathological fracture: Secondary | ICD-10-CM | POA: Diagnosis not present

## 2014-06-24 DIAGNOSIS — M25552 Pain in left hip: Secondary | ICD-10-CM

## 2014-06-24 DIAGNOSIS — M1612 Unilateral primary osteoarthritis, left hip: Secondary | ICD-10-CM | POA: Diagnosis not present

## 2014-06-24 DIAGNOSIS — M47816 Spondylosis without myelopathy or radiculopathy, lumbar region: Secondary | ICD-10-CM | POA: Diagnosis not present

## 2014-06-24 DIAGNOSIS — M25559 Pain in unspecified hip: Secondary | ICD-10-CM | POA: Diagnosis not present

## 2014-10-22 DIAGNOSIS — E782 Mixed hyperlipidemia: Secondary | ICD-10-CM | POA: Diagnosis not present

## 2014-10-22 DIAGNOSIS — F329 Major depressive disorder, single episode, unspecified: Secondary | ICD-10-CM | POA: Diagnosis not present

## 2014-10-22 DIAGNOSIS — M810 Age-related osteoporosis without current pathological fracture: Secondary | ICD-10-CM | POA: Diagnosis not present

## 2014-10-22 DIAGNOSIS — I10 Essential (primary) hypertension: Secondary | ICD-10-CM | POA: Diagnosis not present

## 2014-11-04 DIAGNOSIS — L570 Actinic keratosis: Secondary | ICD-10-CM | POA: Diagnosis not present

## 2014-11-04 DIAGNOSIS — Z86018 Personal history of other benign neoplasm: Secondary | ICD-10-CM | POA: Diagnosis not present

## 2014-11-04 DIAGNOSIS — D0339 Melanoma in situ of other parts of face: Secondary | ICD-10-CM | POA: Diagnosis not present

## 2014-11-04 DIAGNOSIS — Z85828 Personal history of other malignant neoplasm of skin: Secondary | ICD-10-CM | POA: Diagnosis not present

## 2014-11-04 DIAGNOSIS — L820 Inflamed seborrheic keratosis: Secondary | ICD-10-CM | POA: Diagnosis not present

## 2014-11-04 DIAGNOSIS — D225 Melanocytic nevi of trunk: Secondary | ICD-10-CM | POA: Diagnosis not present

## 2014-11-04 DIAGNOSIS — D485 Neoplasm of uncertain behavior of skin: Secondary | ICD-10-CM | POA: Diagnosis not present

## 2014-11-04 DIAGNOSIS — L821 Other seborrheic keratosis: Secondary | ICD-10-CM | POA: Diagnosis not present

## 2014-11-08 ENCOUNTER — Encounter: Payer: Self-pay | Admitting: Gastroenterology

## 2014-12-19 DIAGNOSIS — D0339 Melanoma in situ of other parts of face: Secondary | ICD-10-CM | POA: Diagnosis not present

## 2014-12-19 DIAGNOSIS — C4339 Malignant melanoma of other parts of face: Secondary | ICD-10-CM | POA: Diagnosis not present

## 2015-02-25 DIAGNOSIS — Z23 Encounter for immunization: Secondary | ICD-10-CM | POA: Diagnosis not present

## 2015-04-22 DIAGNOSIS — Z0000 Encounter for general adult medical examination without abnormal findings: Secondary | ICD-10-CM | POA: Diagnosis not present

## 2015-04-22 DIAGNOSIS — Z1389 Encounter for screening for other disorder: Secondary | ICD-10-CM | POA: Diagnosis not present

## 2015-04-22 DIAGNOSIS — D0339 Melanoma in situ of other parts of face: Secondary | ICD-10-CM | POA: Diagnosis not present

## 2015-04-22 DIAGNOSIS — R5383 Other fatigue: Secondary | ICD-10-CM | POA: Diagnosis not present

## 2015-04-22 DIAGNOSIS — K635 Polyp of colon: Secondary | ICD-10-CM | POA: Diagnosis not present

## 2015-04-22 DIAGNOSIS — M810 Age-related osteoporosis without current pathological fracture: Secondary | ICD-10-CM | POA: Diagnosis not present

## 2015-04-22 DIAGNOSIS — F329 Major depressive disorder, single episode, unspecified: Secondary | ICD-10-CM | POA: Diagnosis not present

## 2015-04-22 DIAGNOSIS — I10 Essential (primary) hypertension: Secondary | ICD-10-CM | POA: Diagnosis not present

## 2015-04-22 DIAGNOSIS — E782 Mixed hyperlipidemia: Secondary | ICD-10-CM | POA: Diagnosis not present

## 2015-05-05 DIAGNOSIS — Z1211 Encounter for screening for malignant neoplasm of colon: Secondary | ICD-10-CM | POA: Diagnosis not present

## 2015-10-21 DIAGNOSIS — M810 Age-related osteoporosis without current pathological fracture: Secondary | ICD-10-CM | POA: Diagnosis not present

## 2015-10-21 DIAGNOSIS — I10 Essential (primary) hypertension: Secondary | ICD-10-CM | POA: Diagnosis not present

## 2015-10-21 DIAGNOSIS — E7800 Pure hypercholesterolemia, unspecified: Secondary | ICD-10-CM | POA: Diagnosis not present

## 2015-10-21 DIAGNOSIS — F339 Major depressive disorder, recurrent, unspecified: Secondary | ICD-10-CM | POA: Diagnosis not present

## 2016-02-04 DIAGNOSIS — Z23 Encounter for immunization: Secondary | ICD-10-CM | POA: Diagnosis not present

## 2016-04-26 DIAGNOSIS — Z0000 Encounter for general adult medical examination without abnormal findings: Secondary | ICD-10-CM | POA: Diagnosis not present

## 2016-04-26 DIAGNOSIS — F3341 Major depressive disorder, recurrent, in partial remission: Secondary | ICD-10-CM | POA: Diagnosis not present

## 2016-04-26 DIAGNOSIS — E7800 Pure hypercholesterolemia, unspecified: Secondary | ICD-10-CM | POA: Diagnosis not present

## 2016-04-26 DIAGNOSIS — M810 Age-related osteoporosis without current pathological fracture: Secondary | ICD-10-CM | POA: Diagnosis not present

## 2016-04-26 DIAGNOSIS — I10 Essential (primary) hypertension: Secondary | ICD-10-CM | POA: Diagnosis not present

## 2016-04-26 DIAGNOSIS — Z1389 Encounter for screening for other disorder: Secondary | ICD-10-CM | POA: Diagnosis not present

## 2016-04-26 DIAGNOSIS — J309 Allergic rhinitis, unspecified: Secondary | ICD-10-CM | POA: Diagnosis not present

## 2016-07-08 DIAGNOSIS — H52201 Unspecified astigmatism, right eye: Secondary | ICD-10-CM | POA: Diagnosis not present

## 2016-07-08 DIAGNOSIS — H5203 Hypermetropia, bilateral: Secondary | ICD-10-CM | POA: Diagnosis not present

## 2016-07-08 DIAGNOSIS — H524 Presbyopia: Secondary | ICD-10-CM | POA: Diagnosis not present

## 2016-07-08 DIAGNOSIS — H2513 Age-related nuclear cataract, bilateral: Secondary | ICD-10-CM | POA: Diagnosis not present

## 2016-08-04 DIAGNOSIS — H2513 Age-related nuclear cataract, bilateral: Secondary | ICD-10-CM | POA: Diagnosis not present

## 2016-08-25 DIAGNOSIS — H2512 Age-related nuclear cataract, left eye: Secondary | ICD-10-CM | POA: Diagnosis not present

## 2016-10-07 DIAGNOSIS — H2511 Age-related nuclear cataract, right eye: Secondary | ICD-10-CM | POA: Diagnosis not present

## 2016-10-12 DIAGNOSIS — H35372 Puckering of macula, left eye: Secondary | ICD-10-CM | POA: Diagnosis not present

## 2016-10-12 DIAGNOSIS — H59032 Cystoid macular edema following cataract surgery, left eye: Secondary | ICD-10-CM | POA: Diagnosis not present

## 2016-10-12 DIAGNOSIS — H43813 Vitreous degeneration, bilateral: Secondary | ICD-10-CM | POA: Diagnosis not present

## 2016-10-26 DIAGNOSIS — R350 Frequency of micturition: Secondary | ICD-10-CM | POA: Diagnosis not present

## 2016-10-26 DIAGNOSIS — E7800 Pure hypercholesterolemia, unspecified: Secondary | ICD-10-CM | POA: Diagnosis not present

## 2016-10-26 DIAGNOSIS — R1084 Generalized abdominal pain: Secondary | ICD-10-CM | POA: Diagnosis not present

## 2016-10-26 DIAGNOSIS — F3341 Major depressive disorder, recurrent, in partial remission: Secondary | ICD-10-CM | POA: Diagnosis not present

## 2016-10-26 DIAGNOSIS — M810 Age-related osteoporosis without current pathological fracture: Secondary | ICD-10-CM | POA: Diagnosis not present

## 2016-10-26 DIAGNOSIS — I10 Essential (primary) hypertension: Secondary | ICD-10-CM | POA: Diagnosis not present

## 2016-11-02 DIAGNOSIS — R319 Hematuria, unspecified: Secondary | ICD-10-CM | POA: Diagnosis not present

## 2016-11-02 DIAGNOSIS — H43813 Vitreous degeneration, bilateral: Secondary | ICD-10-CM | POA: Diagnosis not present

## 2016-11-02 DIAGNOSIS — N390 Urinary tract infection, site not specified: Secondary | ICD-10-CM | POA: Diagnosis not present

## 2016-11-02 DIAGNOSIS — H35372 Puckering of macula, left eye: Secondary | ICD-10-CM | POA: Diagnosis not present

## 2016-11-02 DIAGNOSIS — H2511 Age-related nuclear cataract, right eye: Secondary | ICD-10-CM | POA: Diagnosis not present

## 2016-11-10 DIAGNOSIS — H2511 Age-related nuclear cataract, right eye: Secondary | ICD-10-CM | POA: Diagnosis not present

## 2017-02-08 DIAGNOSIS — Z23 Encounter for immunization: Secondary | ICD-10-CM | POA: Diagnosis not present

## 2017-03-17 DIAGNOSIS — Z961 Presence of intraocular lens: Secondary | ICD-10-CM | POA: Diagnosis not present

## 2017-03-17 DIAGNOSIS — H04123 Dry eye syndrome of bilateral lacrimal glands: Secondary | ICD-10-CM | POA: Diagnosis not present

## 2017-04-21 DIAGNOSIS — R319 Hematuria, unspecified: Secondary | ICD-10-CM | POA: Diagnosis not present

## 2017-04-21 DIAGNOSIS — N390 Urinary tract infection, site not specified: Secondary | ICD-10-CM | POA: Diagnosis not present

## 2017-05-12 DIAGNOSIS — F3341 Major depressive disorder, recurrent, in partial remission: Secondary | ICD-10-CM | POA: Diagnosis not present

## 2017-05-12 DIAGNOSIS — Z1389 Encounter for screening for other disorder: Secondary | ICD-10-CM | POA: Diagnosis not present

## 2017-05-12 DIAGNOSIS — Z85820 Personal history of malignant melanoma of skin: Secondary | ICD-10-CM | POA: Diagnosis not present

## 2017-05-12 DIAGNOSIS — Z0000 Encounter for general adult medical examination without abnormal findings: Secondary | ICD-10-CM | POA: Diagnosis not present

## 2017-05-12 DIAGNOSIS — M810 Age-related osteoporosis without current pathological fracture: Secondary | ICD-10-CM | POA: Diagnosis not present

## 2017-05-12 DIAGNOSIS — I10 Essential (primary) hypertension: Secondary | ICD-10-CM | POA: Diagnosis not present

## 2017-05-12 DIAGNOSIS — E7800 Pure hypercholesterolemia, unspecified: Secondary | ICD-10-CM | POA: Diagnosis not present

## 2017-05-24 DIAGNOSIS — J069 Acute upper respiratory infection, unspecified: Secondary | ICD-10-CM | POA: Diagnosis not present

## 2017-06-17 ENCOUNTER — Other Ambulatory Visit: Payer: Self-pay | Admitting: Internal Medicine

## 2017-06-17 ENCOUNTER — Ambulatory Visit
Admission: RE | Admit: 2017-06-17 | Discharge: 2017-06-17 | Disposition: A | Discharge status: Home / Self Care | Payer: Medicare Other | Source: Ambulatory Visit | Attending: Internal Medicine | Admitting: Internal Medicine

## 2017-06-17 DIAGNOSIS — R059 Cough, unspecified: Secondary | ICD-10-CM

## 2017-06-17 DIAGNOSIS — R05 Cough: Secondary | ICD-10-CM | POA: Diagnosis not present

## 2017-06-17 DIAGNOSIS — J069 Acute upper respiratory infection, unspecified: Secondary | ICD-10-CM | POA: Diagnosis not present

## 2017-08-03 DIAGNOSIS — M545 Low back pain: Secondary | ICD-10-CM | POA: Diagnosis not present

## 2017-08-03 DIAGNOSIS — M25552 Pain in left hip: Secondary | ICD-10-CM | POA: Diagnosis not present

## 2017-08-09 DIAGNOSIS — M79652 Pain in left thigh: Secondary | ICD-10-CM | POA: Diagnosis not present

## 2017-08-10 DIAGNOSIS — M545 Low back pain: Secondary | ICD-10-CM | POA: Diagnosis not present

## 2017-08-10 DIAGNOSIS — M25552 Pain in left hip: Secondary | ICD-10-CM | POA: Diagnosis not present

## 2017-08-15 DIAGNOSIS — M545 Low back pain: Secondary | ICD-10-CM | POA: Diagnosis not present

## 2017-08-17 DIAGNOSIS — M545 Low back pain: Secondary | ICD-10-CM | POA: Diagnosis not present

## 2017-08-17 DIAGNOSIS — M5416 Radiculopathy, lumbar region: Secondary | ICD-10-CM | POA: Diagnosis not present

## 2017-08-25 DIAGNOSIS — M9903 Segmental and somatic dysfunction of lumbar region: Secondary | ICD-10-CM | POA: Diagnosis not present

## 2017-08-25 DIAGNOSIS — M5442 Lumbago with sciatica, left side: Secondary | ICD-10-CM | POA: Diagnosis not present

## 2017-08-29 DIAGNOSIS — M9903 Segmental and somatic dysfunction of lumbar region: Secondary | ICD-10-CM | POA: Diagnosis not present

## 2017-08-29 DIAGNOSIS — M5442 Lumbago with sciatica, left side: Secondary | ICD-10-CM | POA: Diagnosis not present

## 2017-08-30 DIAGNOSIS — M9903 Segmental and somatic dysfunction of lumbar region: Secondary | ICD-10-CM | POA: Diagnosis not present

## 2017-08-30 DIAGNOSIS — M5442 Lumbago with sciatica, left side: Secondary | ICD-10-CM | POA: Diagnosis not present

## 2017-08-31 DIAGNOSIS — M5442 Lumbago with sciatica, left side: Secondary | ICD-10-CM | POA: Diagnosis not present

## 2017-08-31 DIAGNOSIS — M9903 Segmental and somatic dysfunction of lumbar region: Secondary | ICD-10-CM | POA: Diagnosis not present

## 2017-09-01 DIAGNOSIS — M5442 Lumbago with sciatica, left side: Secondary | ICD-10-CM | POA: Diagnosis not present

## 2017-09-01 DIAGNOSIS — M9903 Segmental and somatic dysfunction of lumbar region: Secondary | ICD-10-CM | POA: Diagnosis not present

## 2017-09-05 DIAGNOSIS — M9903 Segmental and somatic dysfunction of lumbar region: Secondary | ICD-10-CM | POA: Diagnosis not present

## 2017-09-05 DIAGNOSIS — M5442 Lumbago with sciatica, left side: Secondary | ICD-10-CM | POA: Diagnosis not present

## 2017-09-06 DIAGNOSIS — M9903 Segmental and somatic dysfunction of lumbar region: Secondary | ICD-10-CM | POA: Diagnosis not present

## 2017-09-06 DIAGNOSIS — M5442 Lumbago with sciatica, left side: Secondary | ICD-10-CM | POA: Diagnosis not present

## 2017-09-08 DIAGNOSIS — M5442 Lumbago with sciatica, left side: Secondary | ICD-10-CM | POA: Diagnosis not present

## 2017-09-08 DIAGNOSIS — M9903 Segmental and somatic dysfunction of lumbar region: Secondary | ICD-10-CM | POA: Diagnosis not present

## 2017-09-13 DIAGNOSIS — M5416 Radiculopathy, lumbar region: Secondary | ICD-10-CM | POA: Diagnosis not present

## 2017-09-13 DIAGNOSIS — M25552 Pain in left hip: Secondary | ICD-10-CM | POA: Diagnosis not present

## 2017-09-13 DIAGNOSIS — M545 Low back pain: Secondary | ICD-10-CM | POA: Diagnosis not present

## 2017-09-16 DIAGNOSIS — M542 Cervicalgia: Secondary | ICD-10-CM | POA: Diagnosis not present

## 2017-09-23 DIAGNOSIS — M461 Sacroiliitis, not elsewhere classified: Secondary | ICD-10-CM | POA: Diagnosis not present

## 2017-09-23 DIAGNOSIS — M5416 Radiculopathy, lumbar region: Secondary | ICD-10-CM | POA: Diagnosis not present

## 2017-09-23 DIAGNOSIS — M25552 Pain in left hip: Secondary | ICD-10-CM | POA: Diagnosis not present

## 2017-09-23 DIAGNOSIS — M545 Low back pain: Secondary | ICD-10-CM | POA: Diagnosis not present

## 2017-10-18 ENCOUNTER — Other Ambulatory Visit: Payer: Self-pay | Admitting: Neurological Surgery

## 2017-10-18 DIAGNOSIS — M5417 Radiculopathy, lumbosacral region: Secondary | ICD-10-CM | POA: Diagnosis not present

## 2017-10-21 NOTE — Pre-Procedure Instructions (Signed)
    MICHAELIA BEILFUSS  10/21/2017      [facility] [place], [place] - [address] [place] [address] [place] [place] [address] Phone: [phone] Fax: [phone]    Your procedure is scheduled on 10/26/17.  Report to [facility] Admitting at 630 A.M.  Call this number if you have problems the morning of surgery:  [phone]   Remember:  No food after midnight.     Take these medicines the morning of surgery with A SIP OF WATER ---norvasc,atenolol,celexa,neurontin,ultram    Do not wear jewelry, make-up or nail polish.  Do not wear lotions, powders, or perfumes, or deodorant.  Do not shave 48 hours prior to surgery.  Men may shave face and neck.  Do not bring valuables to the hospital.  [facility] is not responsible for any belongings or valuables.  Contacts, dentures or bridgework may not be worn into surgery.  Leave your suitcase in the car.  After surgery it may be brought to your room.  For patients admitted to the hospital, discharge time will be determined by your treatment team.  Patients discharged the day of surgery will not be allowed to drive home.   Name and phone number of your driver:   Special instructions:  Do not take any aspirin,anti-inflammatories,vitamins,or herbal supplements 5-7 days prior to surgery.  Please read over the following fact sheets that you were given. MRSA Information

## 2017-10-24 ENCOUNTER — Encounter (HOSPITAL_COMMUNITY): Payer: Self-pay | Admitting: *Deleted

## 2017-10-24 ENCOUNTER — Encounter (HOSPITAL_COMMUNITY)
Admission: RE | Admit: 2017-10-24 | Discharge: 2017-10-24 | Disposition: A | Discharge status: Home / Self Care | Payer: Medicare Other | Source: Ambulatory Visit | Attending: Neurological Surgery | Admitting: Neurological Surgery

## 2017-10-24 DIAGNOSIS — Z79899 Other long term (current) drug therapy: Secondary | ICD-10-CM | POA: Diagnosis not present

## 2017-10-24 DIAGNOSIS — F329 Major depressive disorder, single episode, unspecified: Secondary | ICD-10-CM | POA: Diagnosis not present

## 2017-10-24 DIAGNOSIS — M7138 Other bursal cyst, other site: Secondary | ICD-10-CM | POA: Diagnosis not present

## 2017-10-24 DIAGNOSIS — E7800 Pure hypercholesterolemia, unspecified: Secondary | ICD-10-CM | POA: Diagnosis not present

## 2017-10-24 DIAGNOSIS — Z7982 Long term (current) use of aspirin: Secondary | ICD-10-CM | POA: Diagnosis not present

## 2017-10-24 DIAGNOSIS — M5418 Radiculopathy, sacral and sacrococcygeal region: Secondary | ICD-10-CM | POA: Diagnosis not present

## 2017-10-24 DIAGNOSIS — I10 Essential (primary) hypertension: Secondary | ICD-10-CM | POA: Diagnosis not present

## 2017-10-24 DIAGNOSIS — M810 Age-related osteoporosis without current pathological fracture: Secondary | ICD-10-CM | POA: Diagnosis not present

## 2017-10-24 DIAGNOSIS — Z7983 Long term (current) use of bisphosphonates: Secondary | ICD-10-CM | POA: Diagnosis not present

## 2017-10-24 HISTORY — DX: Unspecified osteoarthritis, unspecified site: M19.90

## 2017-10-24 HISTORY — DX: Anxiety disorder, unspecified: F41.9

## 2017-10-24 LAB — CBC WITH DIFFERENTIAL/PLATELET
Abs Immature Granulocytes: 0 10*3/uL (ref 0.0–0.1)
Basophils Absolute: 0 10*3/uL (ref 0.0–0.1)
Basophils Relative: 0 %
Eosinophils Absolute: 0.1 10*3/uL (ref 0.0–0.7)
Eosinophils Relative: 1 %
HCT: 45.1 % (ref 36.0–46.0)
Hemoglobin: 14.3 g/dL (ref 12.0–15.0)
Immature Granulocytes: 1 %
Lymphocytes Relative: 19 %
Lymphs Abs: 1.5 10*3/uL (ref 0.7–4.0)
MCH: 28.5 pg (ref 26.0–34.0)
MCHC: 31.7 g/dL (ref 30.0–36.0)
MCV: 89.8 fL (ref 78.0–100.0)
Monocytes Absolute: 0.6 10*3/uL (ref 0.1–1.0)
Monocytes Relative: 7 %
Neutro Abs: 5.7 10*3/uL (ref 1.7–7.7)
Neutrophils Relative %: 72 %
Platelets: 188 10*3/uL (ref 150–400)
RBC: 5.02 MIL/uL (ref 3.87–5.11)
RDW: 15 % (ref 11.5–15.5)
WBC: 7.9 10*3/uL (ref 4.0–10.5)

## 2017-10-24 LAB — BASIC METABOLIC PANEL
Anion gap: 8 (ref 5–15)
BUN: 7 mg/dL (ref 6–20)
CO2: 26 mmol/L (ref 22–32)
Calcium: 10.1 mg/dL (ref 8.9–10.3)
Chloride: 108 mmol/L (ref 101–111)
Creatinine, Ser: 0.6 mg/dL (ref 0.44–1.00)
GFR calc Af Amer: >60 mL/min (ref >60)
GFR calc non Af Amer: >60 mL/min (ref >60)
Glucose, Bld: 112 mg/dL — ABNORMAL HIGH (ref 65–99)
Potassium: 4.1 mmol/L (ref 3.5–5.1)
Sodium: 142 mmol/L (ref 135–145)

## 2017-10-24 LAB — SURGICAL PCR SCREEN
MRSA, PCR: NEGATIVE
Staphylococcus aureus: NEGATIVE

## 2017-10-24 LAB — PROTIME-INR
INR: 1.04
Prothrombin Time: 13.5 seconds (ref 11.4–15.2)

## 2017-10-26 ENCOUNTER — Observation Stay (HOSPITAL_COMMUNITY)
Admission: RE | Admit: 2017-10-26 | Discharge: 2017-10-26 | Disposition: A | Discharge status: Home / Self Care | Payer: Medicare Other | Source: Ambulatory Visit | Attending: Neurological Surgery | Admitting: Neurological Surgery

## 2017-10-26 ENCOUNTER — Encounter (HOSPITAL_COMMUNITY): Payer: Self-pay | Admitting: Certified Registered Nurse Anesthetist

## 2017-10-26 ENCOUNTER — Encounter (HOSPITAL_COMMUNITY)
Admission: RE | Disposition: A | Discharge status: Home / Self Care | Payer: Self-pay | Source: Ambulatory Visit | Attending: Neurological Surgery

## 2017-10-26 ENCOUNTER — Inpatient Hospital Stay (HOSPITAL_COMMUNITY): Payer: Medicare Other

## 2017-10-26 ENCOUNTER — Other Ambulatory Visit: Payer: Self-pay

## 2017-10-26 ENCOUNTER — Inpatient Hospital Stay (HOSPITAL_COMMUNITY): Payer: Medicare Other | Admitting: Anesthesiology

## 2017-10-26 DIAGNOSIS — M7138 Other bursal cyst, other site: Secondary | ICD-10-CM | POA: Diagnosis not present

## 2017-10-26 DIAGNOSIS — Z7982 Long term (current) use of aspirin: Secondary | ICD-10-CM | POA: Diagnosis not present

## 2017-10-26 DIAGNOSIS — I10 Essential (primary) hypertension: Secondary | ICD-10-CM | POA: Diagnosis not present

## 2017-10-26 DIAGNOSIS — M5418 Radiculopathy, sacral and sacrococcygeal region: Secondary | ICD-10-CM | POA: Insufficient documentation

## 2017-10-26 DIAGNOSIS — Z79899 Other long term (current) drug therapy: Secondary | ICD-10-CM | POA: Insufficient documentation

## 2017-10-26 DIAGNOSIS — F329 Major depressive disorder, single episode, unspecified: Secondary | ICD-10-CM | POA: Insufficient documentation

## 2017-10-26 DIAGNOSIS — M5417 Radiculopathy, lumbosacral region: Secondary | ICD-10-CM | POA: Diagnosis not present

## 2017-10-26 DIAGNOSIS — E7800 Pure hypercholesterolemia, unspecified: Secondary | ICD-10-CM | POA: Insufficient documentation

## 2017-10-26 DIAGNOSIS — M810 Age-related osteoporosis without current pathological fracture: Secondary | ICD-10-CM | POA: Diagnosis not present

## 2017-10-26 DIAGNOSIS — Z9889 Other specified postprocedural states: Secondary | ICD-10-CM

## 2017-10-26 DIAGNOSIS — Z419 Encounter for procedure for purposes other than remedying health state, unspecified: Secondary | ICD-10-CM

## 2017-10-26 DIAGNOSIS — Z981 Arthrodesis status: Secondary | ICD-10-CM | POA: Diagnosis not present

## 2017-10-26 DIAGNOSIS — Z7983 Long term (current) use of bisphosphonates: Secondary | ICD-10-CM | POA: Insufficient documentation

## 2017-10-26 HISTORY — PX: LUMBAR LAMINECTOMY/DECOMPRESSION MICRODISCECTOMY: SHX5026

## 2017-10-26 SURGERY — LUMBAR LAMINECTOMY/DECOMPRESSION MICRODISCECTOMY 1 LEVEL
Anesthesia: General | Site: Spine Lumbar | Laterality: Left

## 2017-10-26 MED ORDER — 0.9 % SODIUM CHLORIDE (POUR BTL) OPTIME
TOPICAL | Status: DC | PRN
  Administered 2017-10-26: 1000 mL

## 2017-10-26 MED ORDER — TRAMADOL HCL 50 MG PO TABS: 50.0000 mg | ORAL_TABLET | Freq: Four times a day (QID) | ORAL | 0 refills | Status: AC | PRN

## 2017-10-26 MED ORDER — ONDANSETRON HCL 4 MG/2ML IJ SOLN
INTRAMUSCULAR | Status: AC
  Filled 2017-10-26: qty 2

## 2017-10-26 MED ORDER — POTASSIUM CHLORIDE IN NACL 20-0.9 MEQ/L-% IV SOLN: INTRAVENOUS | Status: DC

## 2017-10-26 MED ORDER — DEXAMETHASONE SODIUM PHOSPHATE 10 MG/ML IJ SOLN
10.0000 mg | INTRAMUSCULAR | Status: AC
  Administered 2017-10-26: 10 mg via INTRAVENOUS
  Filled 2017-10-26: qty 1

## 2017-10-26 MED ORDER — SODIUM CHLORIDE 0.9% FLUSH: 3.0000 mL | INTRAVENOUS | Status: DC | PRN

## 2017-10-26 MED ORDER — PHENYLEPHRINE HCL 10 MG/ML IJ SOLN
INTRAVENOUS | Status: DC | PRN
  Administered 2017-10-26: 25 ug/min via INTRAVENOUS

## 2017-10-26 MED ORDER — CHLORHEXIDINE GLUCONATE CLOTH 2 % EX PADS: 6.0000 | MEDICATED_PAD | Freq: Once | CUTANEOUS | Status: DC

## 2017-10-26 MED ORDER — TRAMADOL HCL 50 MG PO TABS
ORAL_TABLET | ORAL | Status: AC
  Filled 2017-10-26: qty 1

## 2017-10-26 MED ORDER — HYDROMORPHONE HCL 1 MG/ML IJ SOLN: 0.5000 mg | INTRAMUSCULAR | Status: DC | PRN

## 2017-10-26 MED ORDER — LACTATED RINGERS IV SOLN
INTRAVENOUS | Status: DC | PRN
  Administered 2017-10-26: 08:00:00 via INTRAVENOUS

## 2017-10-26 MED ORDER — BENAZEPRIL HCL 20 MG PO TABS
20.0000 mg | ORAL_TABLET | Freq: Every day | ORAL | Status: DC
  Filled 2017-10-26: qty 1

## 2017-10-26 MED ORDER — OXYCODONE HCL 5 MG/5ML PO SOLN: 5.0000 mg | Freq: Once | ORAL | Status: DC | PRN

## 2017-10-26 MED ORDER — ONDANSETRON HCL 4 MG/2ML IJ SOLN: 4.0000 mg | Freq: Once | INTRAMUSCULAR | Status: DC | PRN

## 2017-10-26 MED ORDER — LIDOCAINE HCL (CARDIAC) PF 100 MG/5ML IV SOSY
PREFILLED_SYRINGE | INTRAVENOUS | Status: DC | PRN
  Administered 2017-10-26: 40 mg via INTRAVENOUS

## 2017-10-26 MED ORDER — SODIUM CHLORIDE 0.9 % IV SOLN
INTRAVENOUS | Status: DC | PRN
  Administered 2017-10-26: 500 mL

## 2017-10-26 MED ORDER — BUPIVACAINE HCL (PF) 0.25 % IJ SOLN
INTRAMUSCULAR | Status: AC
  Filled 2017-10-26: qty 30

## 2017-10-26 MED ORDER — ROCURONIUM BROMIDE 100 MG/10ML IV SOLN
INTRAVENOUS | Status: DC | PRN
  Administered 2017-10-26: 50 mg via INTRAVENOUS

## 2017-10-26 MED ORDER — SODIUM CHLORIDE 0.9 % IV SOLN: 250.0000 mL | INTRAVENOUS | Status: DC

## 2017-10-26 MED ORDER — CITALOPRAM HYDROBROMIDE 20 MG PO TABS
20.0000 mg | ORAL_TABLET | Freq: Every day | ORAL | Status: DC
  Filled 2017-10-26: qty 1

## 2017-10-26 MED ORDER — ONDANSETRON HCL 4 MG/2ML IJ SOLN
INTRAMUSCULAR | Status: DC | PRN
  Administered 2017-10-26: 4 mg via INTRAVENOUS

## 2017-10-26 MED ORDER — TRAMADOL HCL 50 MG PO TABS
50.0000 mg | ORAL_TABLET | Freq: Four times a day (QID) | ORAL | Status: DC | PRN
  Administered 2017-10-26: 50 mg via ORAL

## 2017-10-26 MED ORDER — ATENOLOL 25 MG PO TABS
12.5000 mg | ORAL_TABLET | Freq: Every day | ORAL | Status: DC
  Filled 2017-10-26: qty 0.5

## 2017-10-26 MED ORDER — PROPOFOL 10 MG/ML IV BOLUS
INTRAVENOUS | Status: DC | PRN
  Administered 2017-10-26: 150 mg via INTRAVENOUS

## 2017-10-26 MED ORDER — SODIUM CHLORIDE 0.9 % IJ SOLN
INTRAMUSCULAR | Status: AC
  Filled 2017-10-26: qty 30

## 2017-10-26 MED ORDER — ONDANSETRON HCL 4 MG/2ML IJ SOLN: 4.0000 mg | Freq: Four times a day (QID) | INTRAMUSCULAR | Status: DC | PRN

## 2017-10-26 MED ORDER — LIDOCAINE 2% (20 MG/ML) 5 ML SYRINGE
INTRAMUSCULAR | Status: AC
  Filled 2017-10-26: qty 5

## 2017-10-26 MED ORDER — GABAPENTIN 300 MG PO CAPS: 300.0000 mg | ORAL_CAPSULE | Freq: Three times a day (TID) | ORAL | Status: DC

## 2017-10-26 MED ORDER — CEFAZOLIN SODIUM-DEXTROSE 2-4 GM/100ML-% IV SOLN
2.0000 g | INTRAVENOUS | Status: AC
  Administered 2017-10-26: 2 g via INTRAVENOUS
  Filled 2017-10-26: qty 100

## 2017-10-26 MED ORDER — OXYCODONE HCL 5 MG PO TABS: 5.0000 mg | ORAL_TABLET | Freq: Once | ORAL | Status: DC | PRN

## 2017-10-26 MED ORDER — SENNA 8.6 MG PO TABS: 1.0000 | ORAL_TABLET | Freq: Two times a day (BID) | ORAL | Status: DC

## 2017-10-26 MED ORDER — THROMBIN 5000 UNITS EX SOLR
CUTANEOUS | Status: AC
  Filled 2017-10-26: qty 5000

## 2017-10-26 MED ORDER — PHENOL 1.4 % MT LIQD: 1.0000 | OROMUCOSAL | Status: DC | PRN

## 2017-10-26 MED ORDER — BUPIVACAINE HCL (PF) 0.25 % IJ SOLN
INTRAMUSCULAR | Status: DC | PRN
  Administered 2017-10-26: 10 mL
  Administered 2017-10-26: 5 mL

## 2017-10-26 MED ORDER — PROPOFOL 10 MG/ML IV BOLUS
INTRAVENOUS | Status: AC
  Filled 2017-10-26: qty 20

## 2017-10-26 MED ORDER — CEFAZOLIN SODIUM-DEXTROSE 2-4 GM/100ML-% IV SOLN
2.0000 g | Freq: Three times a day (TID) | INTRAVENOUS | Status: DC
  Administered 2017-10-26: 2 g via INTRAVENOUS
  Filled 2017-10-26: qty 100

## 2017-10-26 MED ORDER — ARTIFICIAL TEARS OPHTHALMIC OINT
TOPICAL_OINTMENT | OPHTHALMIC | Status: DC | PRN
  Administered 2017-10-26: 1 via OPHTHALMIC

## 2017-10-26 MED ORDER — MENTHOL 3 MG MT LOZG: 1.0000 | LOZENGE | OROMUCOSAL | Status: DC | PRN

## 2017-10-26 MED ORDER — AMLODIPINE BESYLATE 5 MG PO TABS: 5.0000 mg | ORAL_TABLET | Freq: Every day | ORAL | Status: DC

## 2017-10-26 MED ORDER — METHOCARBAMOL 500 MG PO TABS
500.0000 mg | ORAL_TABLET | Freq: Four times a day (QID) | ORAL | Status: DC | PRN
  Administered 2017-10-26: 500 mg via ORAL
  Filled 2017-10-26: qty 1

## 2017-10-26 MED ORDER — DEXAMETHASONE SODIUM PHOSPHATE 10 MG/ML IJ SOLN
INTRAMUSCULAR | Status: AC
  Filled 2017-10-26: qty 1

## 2017-10-26 MED ORDER — EPHEDRINE SULFATE 50 MG/ML IJ SOLN
INTRAMUSCULAR | Status: AC
  Filled 2017-10-26: qty 1

## 2017-10-26 MED ORDER — METHOCARBAMOL 1000 MG/10ML IJ SOLN
500.0000 mg | Freq: Four times a day (QID) | INTRAVENOUS | Status: DC | PRN
  Filled 2017-10-26: qty 5

## 2017-10-26 MED ORDER — ROCURONIUM BROMIDE 10 MG/ML (PF) SYRINGE
PREFILLED_SYRINGE | INTRAVENOUS | Status: AC
  Filled 2017-10-26: qty 10

## 2017-10-26 MED ORDER — FENTANYL CITRATE (PF) 250 MCG/5ML IJ SOLN
INTRAMUSCULAR | Status: AC
  Filled 2017-10-26: qty 5

## 2017-10-26 MED ORDER — SUGAMMADEX SODIUM 200 MG/2ML IV SOLN
INTRAVENOUS | Status: DC | PRN
  Administered 2017-10-26: 200 mg via INTRAVENOUS

## 2017-10-26 MED ORDER — FENTANYL CITRATE (PF) 100 MCG/2ML IJ SOLN: 25.0000 ug | INTRAMUSCULAR | Status: DC | PRN

## 2017-10-26 MED ORDER — PHENYLEPHRINE 40 MCG/ML (10ML) SYRINGE FOR IV PUSH (FOR BLOOD PRESSURE SUPPORT)
PREFILLED_SYRINGE | INTRAVENOUS | Status: DC | PRN
  Administered 2017-10-26: 80 ug via INTRAVENOUS

## 2017-10-26 MED ORDER — GELATIN ABSORBABLE MT POWD
OROMUCOSAL | Status: DC | PRN
  Administered 2017-10-26: 5 mL via TOPICAL

## 2017-10-26 MED ORDER — ONDANSETRON HCL 4 MG PO TABS: 4.0000 mg | ORAL_TABLET | Freq: Four times a day (QID) | ORAL | Status: DC | PRN

## 2017-10-26 MED ORDER — SODIUM CHLORIDE 0.9% FLUSH: 3.0000 mL | Freq: Two times a day (BID) | INTRAVENOUS | Status: DC

## 2017-10-26 MED ORDER — SUGAMMADEX SODIUM 200 MG/2ML IV SOLN
INTRAVENOUS | Status: AC
  Filled 2017-10-26: qty 2

## 2017-10-26 MED ORDER — FENTANYL CITRATE (PF) 100 MCG/2ML IJ SOLN
INTRAMUSCULAR | Status: DC | PRN
  Administered 2017-10-26: 100 ug via INTRAVENOUS
  Administered 2017-10-26 (×2): 50 ug via INTRAVENOUS

## 2017-10-26 MED ORDER — OXYCODONE HCL 5 MG PO TABS
5.0000 mg | ORAL_TABLET | Freq: Four times a day (QID) | ORAL | Status: DC | PRN
  Filled 2017-10-26: qty 1

## 2017-10-26 SURGICAL SUPPLY — 49 items
APL SKNCLS STERI-STRIP NONHPOA (GAUZE/BANDAGES/DRESSINGS) ×1
BAG DECANTER FOR FLEXI CONT (MISCELLANEOUS) ×2 IMPLANT
BENZOIN TINCTURE PRP APPL 2/3 (GAUZE/BANDAGES/DRESSINGS) ×2 IMPLANT
BUR MATCHSTICK NEURO 3.0 LAGG (BURR) ×2 IMPLANT
CANISTER SUCT 3000ML PPV (MISCELLANEOUS) ×2 IMPLANT
CARTRIDGE OIL MAESTRO DRILL (MISCELLANEOUS) ×1 IMPLANT
DIFFUSER DRILL AIR PNEUMATIC (MISCELLANEOUS) ×2 IMPLANT
DRAPE LAPAROTOMY 100X72X124 (DRAPES) ×2 IMPLANT
DRAPE MICROSCOPE LEICA (MISCELLANEOUS) ×2 IMPLANT
DRAPE POUCH INSTRU U-SHP 10X18 (DRAPES) ×1 IMPLANT
DRAPE SURG 17X23 STRL (DRAPES) ×2 IMPLANT
DRSG OPSITE POSTOP 3X4 (GAUZE/BANDAGES/DRESSINGS) ×1 IMPLANT
DURAPREP 26ML APPLICATOR (WOUND CARE) ×2 IMPLANT
ELECT REM PT RETURN 9FT ADLT (ELECTROSURGICAL) ×2
ELECTRODE REM PT RTRN 9FT ADLT (ELECTROSURGICAL) ×1 IMPLANT
GAUZE SPONGE 4X4 16PLY XRAY LF (GAUZE/BANDAGES/DRESSINGS) IMPLANT
GLOVE BIO SURGEON STRL SZ7 (GLOVE) ×1 IMPLANT
GLOVE BIO SURGEON STRL SZ8 (GLOVE) ×2 IMPLANT
GLOVE BIOGEL PI IND STRL 6.5 (GLOVE) IMPLANT
GLOVE BIOGEL PI IND STRL 7.0 (GLOVE) IMPLANT
GLOVE BIOGEL PI INDICATOR 6.5 (GLOVE) ×2
GLOVE BIOGEL PI INDICATOR 7.0 (GLOVE) ×1
GLOVE SURG SS PI 6.5 STRL IVOR (GLOVE) ×2 IMPLANT
GOWN STRL REUS W/ TWL LRG LVL3 (GOWN DISPOSABLE) IMPLANT
GOWN STRL REUS W/ TWL XL LVL3 (GOWN DISPOSABLE) ×1 IMPLANT
GOWN STRL REUS W/TWL 2XL LVL3 (GOWN DISPOSABLE) IMPLANT
GOWN STRL REUS W/TWL LRG LVL3 (GOWN DISPOSABLE) ×4
GOWN STRL REUS W/TWL XL LVL3 (GOWN DISPOSABLE) ×2
HEMOSTAT POWDER KIT SURGIFOAM (HEMOSTASIS) ×1 IMPLANT
KIT BASIN OR (CUSTOM PROCEDURE TRAY) ×2 IMPLANT
KIT TURNOVER KIT B (KITS) ×2 IMPLANT
NDL HYPO 25X1 1.5 SAFETY (NEEDLE) ×1 IMPLANT
NDL SPNL 20GX3.5 QUINCKE YW (NEEDLE) IMPLANT
NEEDLE HYPO 25X1 1.5 SAFETY (NEEDLE) ×2 IMPLANT
NEEDLE SPNL 20GX3.5 QUINCKE YW (NEEDLE) ×2 IMPLANT
NS IRRIG 1000ML POUR BTL (IV SOLUTION) ×2 IMPLANT
OIL CARTRIDGE MAESTRO DRILL (MISCELLANEOUS) ×2
PACK LAMINECTOMY NEURO (CUSTOM PROCEDURE TRAY) ×2 IMPLANT
PAD ARMBOARD 7.5X6 YLW CONV (MISCELLANEOUS) ×8 IMPLANT
RUBBERBAND STERILE (MISCELLANEOUS) ×4 IMPLANT
SPONGE SURGIFOAM ABS GEL SZ50 (HEMOSTASIS) IMPLANT
STRIP CLOSURE SKIN 1/2X4 (GAUZE/BANDAGES/DRESSINGS) ×2 IMPLANT
SUT VIC AB 0 CT1 18XCR BRD8 (SUTURE) ×1 IMPLANT
SUT VIC AB 0 CT1 8-18 (SUTURE) ×2
SUT VIC AB 2-0 CP2 18 (SUTURE) ×2 IMPLANT
SUT VIC AB 3-0 SH 8-18 (SUTURE) ×3 IMPLANT
TOWEL GREEN STERILE (TOWEL DISPOSABLE) ×2 IMPLANT
TOWEL GREEN STERILE FF (TOWEL DISPOSABLE) ×2 IMPLANT
WATER STERILE IRR 1000ML POUR (IV SOLUTION) ×2 IMPLANT

## 2017-10-26 NOTE — Progress Notes (Signed)
Patient alert and oriented, mae's well, voiding adequate amount of urine, swallowing without difficulty, no c/o pain at time of discharge. Patient discharged home with family. Script and discharged instructions given to patient. Patient and family stated understanding of instructions given. Patient has an appointment with Dr. Jones °

## 2017-10-26 NOTE — Anesthesia Preprocedure Evaluation (Signed)
Anesthesia Evaluation  Patient identified by MRN, date of birth, ID band Patient awake    Reviewed: Allergy & Precautions, NPO status , Patient's Chart, lab work & pertinent test results  Airway Mallampati: II  TM Distance: >3 FB Neck ROM: Full    Dental  (+) Teeth Intact, Dental Advisory Given   Pulmonary    breath sounds clear to auscultation       Cardiovascular hypertension,  Rhythm:Regular Rate:Normal     Neuro/Psych    GI/Hepatic   Endo/Other    Renal/GU      Musculoskeletal   Abdominal   Peds  Hematology   Anesthesia Other Findings   Reproductive/Obstetrics                             Anesthesia Physical Anesthesia Plan  ASA: III  Anesthesia Plan: General   Post-op Pain Management:    Induction: Intravenous  PONV Risk Score and Plan: 1 and Ondansetron and Dexamethasone  Airway Management Planned: Oral ETT  Additional Equipment:   Intra-op Plan:   Post-operative Plan: Extubation in OR  Informed Consent: I have reviewed the patients History and Physical, chart, labs and discussed the procedure including the risks, benefits and alternatives for the proposed anesthesia with the patient or authorized representative who has indicated his/her understanding and acceptance.   Dental advisory given  Plan Discussed with: CRNA and Anesthesiologist  Anesthesia Plan Comments:         Anesthesia Quick Evaluation

## 2017-10-26 NOTE — Evaluation (Signed)
Occupational Therapy Evaluation Patient Details Name: Cristina Frederick MRN: [id number] DOB: Jul 28, 1940 Today's Date: 10/26/2017    History of Present Illness 77 yo female s/p L5-s1   Past Medical History:  Diagnosis Date  . Anxiety   . Arthritis   . Cataract   . Depression   . Hypercholesterolemia   . Hypertension   . Osteoporosis   . Palpitations    normal stress test and echo3/2013  . Seasonal allergies       Clinical Impression   Patient evaluated by Occupational Therapy with no further acute OT needs identified. All education has been completed and the patient has no further questions. See below for any follow-up Occupational Therapy or equipment needs. OT to sign off. Thank you for referral.      Follow Up Recommendations  No OT follow up    Equipment Recommendations  None recommended by OT    Recommendations for Other Services       Precautions / Restrictions Precautions Precautions: Back Precaution Comments: handout provide dand reviewed in detail for adls Restrictions Weight Bearing Restrictions: No      Mobility Bed Mobility Overal bed mobility: Needs Assistance Bed Mobility: Rolling Rolling: Min assist   Supine to sit: Min assist     General bed mobility comments: pt educated on proper sequence for bed rolling and supin to sit and good return demo  Transfers Overall transfer level: Needs assistance Equipment used: None Transfers: Sit to/from Stand Sit to Stand: Supervision              Balance                                           ADL either performed or assessed with clinical judgement   ADL Overall ADL's : Independent                                       General ADL Comments: able to cross figure 4 for LB dressing.   Back handout provided and reviewed adls in detail. Pt educated on: set an alarm at night for medication, avoid sitting for long periods of time, correct bed positioning for  sleeping, correct sequence for bed mobility, avoiding lifting more than 5 pounds and never wash directly over incision. All education is complete and patient indicates understanding.     Vision Baseline Vision/History: No visual deficits       Perception     Praxis      Pertinent Vitals/Pain Pain Assessment: No/denies pain     Hand Dominance Right   Extremity/Trunk Assessment Upper Extremity Assessment Upper Extremity Assessment: Overall WFL for tasks assessed   Lower Extremity Assessment Lower Extremity Assessment: Overall WFL for tasks assessed   Cervical / Trunk Assessment Cervical / Trunk Assessment: Other exceptions Cervical / Trunk Exceptions: s/p surg   Communication Communication Communication: Interpreter utilized   Cognition Arousal/Alertness: Awake/alert Behavior During Therapy: WFL for tasks assessed/performed Overall Cognitive Status: Within Functional Limits for tasks assessed                                     General Comments  dressing dry and intact at this time. Duaghter x2 present  Exercises     Shoulder Instructions      Home Living Family/patient expects to be discharged to:: Private residence Living Arrangements: Children Available Help at Discharge: Available 24 hours/day Type of Home: House Home Access: Stairs to enter Technical brewer of Steps: 1   Home Layout: One level     Bathroom Shower/Tub: Occupational psychologist: Handicapped height     Home Equipment: Other (comment)(bed with electronic up and down)          Prior Functioning/Environment Level of Independence: Independent                 OT Problem List:        OT Treatment/Interventions:      OT Goals(Current goals can be found in the care plan section) Acute Rehab OT Goals Patient Stated Goal: to go home today  OT Frequency:     Barriers to D/C:            Co-evaluation              AM-PAC PT "6 Clicks"  Daily Activity     Outcome Measure Help from another person eating meals?: None Help from another person taking care of personal grooming?: None Help from another person toileting, which includes using toliet, bedpan, or urinal?: None Help from another person bathing (including washing, rinsing, drying)?: None Help from another person to put on and taking off regular upper body clothing?: None Help from another person to put on and taking off regular lower body clothing?: None 6 Click Score: 24   End of Session Nurse Communication: Mobility status;Precautions  Activity Tolerance: Patient tolerated treatment well Patient left: in chair;with call bell/phone within reach;with family/visitor present  OT Visit Diagnosis: Unsteadiness on feet (R26.81)                Time: 5859-2924 OT Time Calculation (min): 36 min Charges:  OT General Charges $OT Visit: 1 Visit OT Evaluation $OT Eval Moderate Complexity: 1 Mod OT Treatments $Self Care/Home Management : 8-22 mins G-Codes:      Jeri Modena   OTR/L Pager: [phone] Office: [phone] .   Parke Poisson B 10/26/2017, 12:49 PM

## 2017-10-26 NOTE — Anesthesia Postprocedure Evaluation (Signed)
Anesthesia Post Note  Patient: Cristina Frederick  Procedure(s) Performed: Laminectomy for facet/synovial cyst - Lumbar five-Sacral One - left (Left Spine Lumbar)     Patient location during evaluation: PACU Anesthesia Type: General Level of consciousness: awake and alert Pain management: pain level controlled Vital Signs Assessment: post-procedure vital signs reviewed and stable Respiratory status: spontaneous breathing, nonlabored ventilation, respiratory function stable and patient connected to nasal cannula oxygen Cardiovascular status: blood pressure returned to baseline and stable Postop Assessment: no apparent nausea or vomiting Anesthetic complications: no    Last Vitals:  Vitals:   10/26/17 1059 10/26/17 1539  BP: (!) 141/77 125/65  Pulse: 70 70  Resp: 16 16  Temp: 36.6 C 36.6 C  SpO2: 97% 96%    Last Pain:  Vitals:   10/26/17 1539  TempSrc: Oral  PainSc:                  Adda Stokes COKER

## 2017-10-26 NOTE — Anesthesia Procedure Notes (Signed)
Procedure Name: Intubation Date/Time: 10/26/2017 8:42 AM Performed by: Candis Shine, CRNA Pre-anesthesia Checklist: Patient identified, Emergency Drugs available, Suction available and Patient being monitored Patient Re-evaluated:Patient Re-evaluated prior to induction Oxygen Delivery Method: Circle System Utilized Preoxygenation: Pre-oxygenation with 100% oxygen Induction Type: IV induction Ventilation: Mask ventilation without difficulty and Oral airway inserted - appropriate to patient size Laryngoscope Size: Mac and 3 Grade View: Grade I Tube type: Oral Tube size: 7.0 mm Number of attempts: 1 Airway Equipment and Method: Stylet and Oral airway Placement Confirmation: ETT inserted through vocal cords under direct vision,  positive ETCO2 and breath sounds checked- equal and bilateral Secured at: 21 cm Tube secured with: Tape Dental Injury: Teeth and Oropharynx as per pre-operative assessment

## 2017-10-26 NOTE — Op Note (Signed)
10/26/2017  9:53 AM  PATIENT:  Cristina Frederick  77 y.o. 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 Imaging showedsynovial cyst L L5-S1. The patient tried conservative measures without relief. Pain was debilitating. Recommended L L5-S1 hemilam for synovial cyst. Patient understood the risks, benefits, and alternatives and potential outcomes and wished to proceed.  PROCEDURE DETAILS: The patient was taken to the operating room and after induction of adequate generalized endotracheal anesthesia, the patient was rolled into the prone position on the Wilson frame and all pressure points were padded. The lumbar region was cleaned and then prepped with DuraPrep and draped in the usual sterile fashion. 5 cc of local anesthesia was injected and then a dorsal midline incision was made and carried down to the lumbo sacral fascia. The fascia was opened and the paraspinous musculature was taken down in a subperiosteal fashion to expose L5-S1  on the left. Intraoperative x-ray confirmed my level, and then I used a combination of the high-speed drill and the Kerrison punches to perform a hemilaminectomy, medial facetectomy, and foraminotomy at L5-S1 on the left. The underlying yellow ligament was opened and removed in a piecemeal fashion to expose the underlying dura and exiting nerve root. I undercut the lateral recess and dissected down until I was medial to and distal to the pedicle. I found a soft lesion superior medial to the S1 nerve root was resected by  undercutting the lateral recess. I worked until I was distal to the pedicle. The nerve root was well decompressed. We then gently retracted the nerve root medially with a retractor, coagulated the epidural venous vasculature, and inspected the disc space. I found no significant disc herniation. I then palpated with a coronary dilator along the nerve root and into the foramen to assure adequate decompression. I felt no more compression of the nerve root. I irrigated with saline solution containing bacitracin. Achieved hemostasis with bipolar cautery, lined the dura with Gelfoam, and then closed the fascia with 0 Vicryl. I closed the subcutaneous tissues with 2-0 Vicryl and the subcuticular tissues with 3-0 Vicryl. The skin was then closed with benzoin and Steri-Strips. The drapes were removed, a sterile dressing was applied. The patient was awakened from general anesthesia and transferred to the recovery room in stable condition. At the end of the procedure all sponge, needle and instrument counts were correct.    PLAN OF CARE: Admit for overnight observation  PATIENT DISPOSITION:  PACU - hemodynamically stable.   Delay start of Pharmacological VTE agent (>24hrs) due to surgical blood loss or risk of bleeding:  yes

## 2017-10-26 NOTE — Discharge Summary (Signed)
Physician Discharge Summary  Patient ID: Cristina Frederick MRN: [id number] DOB/AGE: 79-Apr-1942 79 y.o.  Admit date: 10/26/2017 Discharge date: 10/26/2017  Admission Diagnoses: l5s1 synovial cyst    Discharge Diagnoses: same   Discharged Condition: good  Hospital Course: The patient was admitted on 10/26/2017 and taken to the operating room where the patient underwent L L5-S1 lami. The patient tolerated the procedure well and was taken to the recovery room and then to the floor in stable condition. The hospital course was routine. There were no complications. The wound remained clean dry and intact. Pt had appropriate back soreness. No complaints of leg pain or new N/T/W. The patient remained afebrile with stable vital signs, and tolerated a regular diet. The patient continued to increase activities, and pain was well controlled with oral pain medications.   Consults: None  Significant Diagnostic Studies:  Results for orders placed or performed during the hospital encounter of 10/24/17  Surgical pcr screen  Result Value Ref Range   MRSA, PCR NEGATIVE NEGATIVE   Staphylococcus aureus NEGATIVE NEGATIVE  Basic metabolic panel  Result Value Ref Range   Sodium 142 135 - 145 mmol/L   Potassium 4.1 3.5 - 5.1 mmol/L   Chloride 108 101 - 111 mmol/L   CO2 26 22 - 32 mmol/L   Glucose, Bld 112 (H) 65 - 99 mg/dL   BUN 7 6 - 20 mg/dL   Creatinine, Ser 0.60 0.44 - 1.00 mg/dL   Calcium 10.1 8.9 - 10.3 mg/dL   GFR calc non Af Amer >60 >60 mL/min   GFR calc Af Amer >60 >60 mL/min   Anion gap 8 5 - 15  CBC WITH DIFFERENTIAL  Result Value Ref Range   WBC 7.9 4.0 - 10.5 K/uL   RBC 5.02 3.87 - 5.11 MIL/uL   Hemoglobin 14.3 12.0 - 15.0 g/dL   HCT 45.1 36.0 - 46.0 %   MCV 89.8 78.0 - 100.0 fL   MCH 28.5 26.0 - 34.0 pg   MCHC 31.7 30.0 - 36.0 g/dL   RDW 15.0 11.5 - 15.5 %   Platelets 188 150 - 400 K/uL   Neutrophils Relative % 72 %   Neutro Abs 5.7 1.7 - 7.7 K/uL   Lymphocytes Relative 19 %    Lymphs Abs 1.5 0.7 - 4.0 K/uL   Monocytes Relative 7 %   Monocytes Absolute 0.6 0.1 - 1.0 K/uL   Eosinophils Relative 1 %   Eosinophils Absolute 0.1 0.0 - 0.7 K/uL   Basophils Relative 0 %   Basophils Absolute 0.0 0.0 - 0.1 K/uL   Immature Granulocytes 1 %   Abs Immature Granulocytes 0.0 0.0 - 0.1 K/uL  Protime-INR  Result Value Ref Range   Prothrombin Time 13.5 11.4 - 15.2 seconds   INR 1.04     Dg Lumbar Spine 1 View  Result Date: 10/26/2017 CLINICAL DATA:  L5-S1 laminectomy EXAM: LUMBAR SPINE - 1 VIEW COMPARISON:  Lumbar spine MRI dated 08/15/2017 FINDINGS: Surgical probe at L5-S1. IMPRESSION: Lumbar localization as above. Electronically Signed   By: Julian Hy M.D.   On: 10/26/2017 09:35    Antibiotics:  Anti-infectives (From admission, onward)   Start     Dose/Rate Route Frequency Ordered Stop   10/26/17 1100  ceFAZolin (ANCEF) IVPB 2g/100 mL premix     2 g 200 mL/hr over 30 Minutes Intravenous Every 8 hours 10/26/17 1054 10/27/17 0259   10/26/17 0919  bacitracin 50,000 Units in sodium chloride 0.9 % 500 mL irrigation  Status:  Discontinued       As needed 10/26/17 0920 10/26/17 0950   10/26/17 0700  ceFAZolin (ANCEF) IVPB 2g/100 mL premix     2 g 200 mL/hr over 30 Minutes Intravenous On call to O.R. 10/26/17 4782 10/26/17 0844      Discharge Exam: Blood pressure 125/65, pulse 70, temperature 97.8 F (36.6 C), temperature source Oral, resp. rate 16, height 5\' 2"  (1.575 m), weight 55.8 kg (123 lb), SpO2 96 %. Neurologic: Grossly normal Dressing dry  Discharge Medications:   Allergies as of 10/26/2017      Reactions   Acetaminophen Swelling   Eyes swell   Sulfonamide Derivatives Rash      Medication List    TAKE these medications   alendronate 70 MG tablet Commonly known as:  FOSAMAX Take 70 mg by mouth every Sunday. Take with a full glass of water on an empty stomach.   amLODipine 5 MG tablet Commonly known as:  NORVASC Take 5 mg by mouth daily.    aspirin 81 MG tablet Take 81 mg by mouth at bedtime.   atenolol 25 MG tablet Commonly known as:  TENORMIN Take 12.5 mg by mouth daily.   atorvastatin 10 MG tablet Commonly known as:  LIPITOR Take 10 mg by mouth daily.   AZO URINARY TRACT SUPPORT PO Take 1 tablet by mouth 2 (two) times daily.   benazepril 20 MG tablet Commonly known as:  LOTENSIN Take 20 mg by mouth daily.   Calcium-Vitamin D3 600-500 MG-UNIT Caps Take 1 tablet by mouth 2 (two) times daily.   cholecalciferol 1000 units tablet Commonly known as:  VITAMIN D Take 1,000 Units by mouth daily.   citalopram 20 MG tablet Commonly known as:  CELEXA Take 20 mg by mouth daily.   Fish Oil 1000 MG Caps Take 1,000 mg by mouth 2 (two) times daily.   gabapentin 300 MG capsule Commonly known as:  NEURONTIN Take 300 mg by mouth 3 (three) times daily. Take with 100 mg to equal 400 mg 3 times daily   gabapentin 100 MG capsule Commonly known as:  NEURONTIN Take 100 mg by mouth 3 (three) times daily. Take with 300 mg to equal 400 mg 3 times daily   ibuprofen 200 MG tablet Commonly known as:  ADVIL,MOTRIN Take 200 mg by mouth 3 (three) times daily as needed for moderate pain.   multivitamin with minerals Tabs tablet Take 1 tablet by mouth daily.   OSTEO BI-FLEX TRIPLE STRENGTH Tabs Take 1 tablet by mouth daily.   SYSTANE OP Place 1 drop into both eyes 3 (three) times daily.   traMADol 50 MG tablet Commonly known as:  ULTRAM Take 1 tablet (50 mg total) by mouth every 6 (six) hours as needed. What changed:    when to take this  reasons to take this       Disposition: home   Final Dx: L5S1 lami for synovial cyst  Discharge Instructions     Remove dressing in 72 hours   Complete by:  As directed    Call MD for:  difficulty breathing, headache or visual disturbances   Complete by:  As directed    Call MD for:  persistant nausea and vomiting   Complete by:  As directed    Call MD for:  redness,  tenderness, or signs of infection (pain, swelling, redness, odor or green/yellow discharge around incision site)   Complete by:  As directed    Call MD for:  severe uncontrolled pain   Complete by:  As directed    Call MD for:  temperature >100.4   Complete by:  As directed    Diet - low sodium heart healthy   Complete by:  As directed    Increase activity slowly   Complete by:  As directed          Signed: Adelle Zachar S 10/26/2017, 4:55 PM

## 2017-10-26 NOTE — Transfer of Care (Signed)
Immediate Anesthesia Transfer of Care Note  Patient: Cristina Frederick  Procedure(s) Performed: Laminectomy for facet/synovial cyst - Lumbar five-Sacral One - left (Left Spine Lumbar)  Patient Location: PACU  Anesthesia Type:General  Level of Consciousness: awake, alert  and oriented  Airway & Oxygen Therapy: Patient Spontanous Breathing and Patient connected to nasal cannula oxygen  Post-op Assessment: Report given to RN and Post -op Vital signs reviewed and stable  Post vital signs: Reviewed and stable  Last Vitals:  Vitals Value Taken Time  BP 129/82 10/26/2017  9:56 AM  Temp    Pulse 71 10/26/2017  9:59 AM  Resp 15 10/26/2017  9:59 AM  SpO2 99 % 10/26/2017  9:59 AM  Vitals shown include unvalidated device data.  Last Pain:  Vitals:   10/26/17 0722  TempSrc:   PainSc: 10-Worst pain ever      Patients Stated Pain Goal: 3 (67/20/94 7096)  Complications: No apparent anesthesia complications

## 2017-10-26 NOTE — H&P (Signed)
Subjective: Patient is a 77 y.o. female admitted for L L5-S1 synovial cyst. Onset of symptoms was several weeks ago, gradually worsening since that time.  The pain is rated severe, and is located at the across the lower back and radiates to LLE. The pain is described as aching and occurs all day. The symptoms have been progressive. Symptoms are exacerbated by exercise. MRI or CT showed Synovial cyst   Past Medical History:  Diagnosis Date  . Anxiety   . Arthritis   . Cataract   . Depression   . Hypercholesterolemia   . Hypertension   . Osteoporosis   . Palpitations    normal stress test and echo3/2013  . Seasonal allergies     Past Surgical History:  Procedure Laterality Date  . ABDOMINAL HYSTERECTOMY    . BREAST SURGERY     bx  . CHOLECYSTECTOMY      Prior to Admission medications   Medication Sig Start Date End Date Taking? Authorizing Provider  alendronate (FOSAMAX) 70 MG tablet Take 70 mg by mouth every Sunday. Take with a full glass of water on an empty stomach.    Yes [provider]  amLODipine (NORVASC) 5 MG tablet Take 5 mg by mouth daily.   Yes [provider]  aspirin 81 MG tablet Take 81 mg by mouth at bedtime.    Yes [provider]  atenolol (TENORMIN) 25 MG tablet Take 12.5 mg by mouth daily.   Yes [provider]  atorvastatin (LIPITOR) 10 MG tablet Take 10 mg by mouth daily.   Yes [provider]  benazepril (LOTENSIN) 20 MG tablet Take 20 mg by mouth daily.   Yes [provider]  Calcium Carb-Cholecalciferol (CALCIUM-VITAMIN D3) 600-500 MG-UNIT CAPS Take 1 tablet by mouth 2 (two) times daily.   Yes [provider]  cholecalciferol (VITAMIN D) 1000 units tablet Take 1,000 Units by mouth daily.   Yes [provider]  citalopram (CELEXA) 20 MG tablet Take 20 mg by mouth daily.   Yes [provider]  gabapentin (NEURONTIN) 100 MG capsule Take 100 mg by mouth 3 (three) times daily. Take  with 300 mg to equal 400 mg 3 times daily 10/05/17  Yes [provider]  gabapentin (NEURONTIN) 300 MG capsule Take 300 mg by mouth 3 (three) times daily. Take with 100 mg to equal 400 mg 3 times daily 09/13/17  Yes [provider]  ibuprofen (ADVIL,MOTRIN) 200 MG tablet Take 200 mg by mouth 3 (three) times daily as needed for moderate pain.   Yes [provider]  Misc Natural Products (OSTEO BI-FLEX TRIPLE STRENGTH) TABS Take 1 tablet by mouth daily.   Yes [provider]  Multiple Vitamin (MULTIVITAMIN WITH MINERALS) TABS tablet Take 1 tablet by mouth daily.   Yes [provider]  Omega-3 Fatty Acids (FISH OIL) 1000 MG CAPS Take 1,000 mg by mouth 2 (two) times daily.    Yes [provider]  Phenazopyrid-Cranbry-C-Probiot (AZO URINARY TRACT SUPPORT PO) Take 1 tablet by mouth 2 (two) times daily.   Yes [provider]  Polyethyl Glycol-Propyl Glycol (SYSTANE OP) Place 1 drop into both eyes 3 (three) times daily.   Yes [provider]  traMADol (ULTRAM) 50 MG tablet Take 50 mg by mouth 2 (two) times daily as needed for pain. 09/13/17  Yes [provider]   Allergies  Allergen Reactions  . Acetaminophen Swelling    Eyes swell  . Sulfonamide Derivatives Rash  Social History   Tobacco Use  . Smoking status: Never Smoker  . Smokeless tobacco: Never Used  Substance Use Topics  . Alcohol use: Not on file    Comment: occ    History reviewed. No pertinent family history.   Review of Systems  Positive ROS: neg  All other systems have been reviewed and were otherwise negative with the exception of those mentioned in the HPI and as above.  Objective: Vital signs in last 24 hours: Temp:  [98.7 F (37.1 C)] 98.7 F (37.1 C) (06/12 0648) Pulse Rate:  [72] 72 (06/12 0648) Resp:  [20] 20 (06/12 0648) BP: (170)/(82) 170/82 (06/12 0648) SpO2:  [97 %] 97 % (06/12 0648) Weight:  [55.8 kg (123 lb)] 55.8 kg (123 lb)  (06/12 0722)  General Appearance: Alert, cooperative, no distress, appears stated age Head: Normocephalic, without obvious abnormality, atraumatic Eyes: PERRL, conjunctiva/corneas clear, EOM's intact    Neck: Supple, symmetrical, trachea midline Back: Symmetric, no curvature, ROM normal, no CVA tenderness Lungs:  respirations unlabored Heart: Regular rate and rhythm Abdomen: Soft, non-tender Extremities: Extremities normal, atraumatic, no cyanosis or edema Pulses: 2+ and symmetric all extremities Skin: Skin color, texture, turgor normal, no rashes or lesions  NEUROLOGIC:   Mental status: Alert and oriented x4,  no aphasia, good attention span, fund of knowledge, and memory Motor Exam - grossly normal Sensory Exam - grossly normal Reflexes: 1+ Coordination - grossly normal Gait - grossly normal Balance - grossly normal Cranial Nerves: I: smell Not tested  II: visual acuity  OS: nl    OD: nl  II: visual fields Full to confrontation  II: pupils Equal, round, reactive to light  III,VII: ptosis None  III,IV,VI: extraocular muscles  Full ROM  V: mastication Normal  V: facial light touch sensation  Normal  V,VII: corneal reflex  Present  VII: facial muscle function - upper  Normal  VII: facial muscle function - lower Normal  VIII: hearing Not tested  IX: soft palate elevation  Normal  IX,X: gag reflex Present  XI: trapezius strength  5/5  XI: sternocleidomastoid strength 5/5  XI: neck flexion strength  5/5  XII: tongue strength  Normal    Data Review Lab Results  Component Value Date   WBC 7.9 10/24/2017   HGB 14.3 10/24/2017   HCT 45.1 10/24/2017   MCV 89.8 10/24/2017   PLT 188 10/24/2017   Lab Results  Component Value Date   NA 142 10/24/2017   K 4.1 10/24/2017   CL 108 10/24/2017   CO2 26 10/24/2017   BUN 7 10/24/2017   CREATININE 0.60 10/24/2017   GLUCOSE 112 (H) 10/24/2017   Lab Results  Component Value Date   INR 1.04 10/24/2017     Assessment/Plan:  Estimated body mass index is 22.5 kg/m as calculated from the following:   Height as of this encounter: 5\' 2"  (1.575 m).   Weight as of this encounter: 55.8 kg (123 lb). Patient admitted for L L5-S1 synovial cyst resection. Patient has failed a reasonable attempt at conservative therapy.  I explained the condition and procedure to the patient and answered any questions.  Patient wishes to proceed with procedure as planned. Understands risks/ benefits and typical outcomes of procedure.   Zaydah Nawabi S 10/26/2017 8:26 AM

## 2017-10-27 ENCOUNTER — Encounter (HOSPITAL_COMMUNITY): Payer: Self-pay | Admitting: Neurological Surgery

## 2017-11-21 DIAGNOSIS — F3341 Major depressive disorder, recurrent, in partial remission: Secondary | ICD-10-CM | POA: Diagnosis not present

## 2017-11-21 DIAGNOSIS — Z85820 Personal history of malignant melanoma of skin: Secondary | ICD-10-CM | POA: Diagnosis not present

## 2017-11-21 DIAGNOSIS — I10 Essential (primary) hypertension: Secondary | ICD-10-CM | POA: Diagnosis not present

## 2017-11-21 DIAGNOSIS — M810 Age-related osteoporosis without current pathological fracture: Secondary | ICD-10-CM | POA: Diagnosis not present

## 2017-11-21 DIAGNOSIS — E7800 Pure hypercholesterolemia, unspecified: Secondary | ICD-10-CM | POA: Diagnosis not present

## 2017-12-01 DIAGNOSIS — M810 Age-related osteoporosis without current pathological fracture: Secondary | ICD-10-CM | POA: Diagnosis not present

## 2018-02-08 DIAGNOSIS — Z23 Encounter for immunization: Secondary | ICD-10-CM | POA: Diagnosis not present

## 2018-05-25 DIAGNOSIS — F329 Major depressive disorder, single episode, unspecified: Secondary | ICD-10-CM | POA: Diagnosis not present

## 2018-05-25 DIAGNOSIS — F3341 Major depressive disorder, recurrent, in partial remission: Secondary | ICD-10-CM | POA: Diagnosis not present

## 2018-05-25 DIAGNOSIS — Z1389 Encounter for screening for other disorder: Secondary | ICD-10-CM | POA: Diagnosis not present

## 2018-05-25 DIAGNOSIS — I10 Essential (primary) hypertension: Secondary | ICD-10-CM | POA: Diagnosis not present

## 2018-05-25 DIAGNOSIS — R5383 Other fatigue: Secondary | ICD-10-CM | POA: Diagnosis not present

## 2018-05-25 DIAGNOSIS — E7800 Pure hypercholesterolemia, unspecified: Secondary | ICD-10-CM | POA: Diagnosis not present

## 2018-05-25 DIAGNOSIS — Z0000 Encounter for general adult medical examination without abnormal findings: Secondary | ICD-10-CM | POA: Diagnosis not present

## 2018-05-25 DIAGNOSIS — M810 Age-related osteoporosis without current pathological fracture: Secondary | ICD-10-CM | POA: Diagnosis not present

## 2018-07-12 DIAGNOSIS — Z961 Presence of intraocular lens: Secondary | ICD-10-CM | POA: Diagnosis not present

## 2018-07-26 DIAGNOSIS — D485 Neoplasm of uncertain behavior of skin: Secondary | ICD-10-CM | POA: Diagnosis not present

## 2018-07-26 DIAGNOSIS — L821 Other seborrheic keratosis: Secondary | ICD-10-CM | POA: Diagnosis not present

## 2018-07-26 DIAGNOSIS — D0439 Carcinoma in situ of skin of other parts of face: Secondary | ICD-10-CM | POA: Diagnosis not present

## 2018-07-26 DIAGNOSIS — Z23 Encounter for immunization: Secondary | ICD-10-CM | POA: Diagnosis not present

## 2018-07-26 DIAGNOSIS — L570 Actinic keratosis: Secondary | ICD-10-CM | POA: Diagnosis not present

## 2018-10-15 IMAGING — CR DG LUMBAR SPINE 1V
1 series · 1 of 1 positions shown · non-contrast
Comparison: Lumbar spine MRI dated 08/15/2017

CLINICAL DATA: L5-S1 laminectomy

EXAM:
LUMBAR SPINE - 1 VIEW

[xtable lateral]
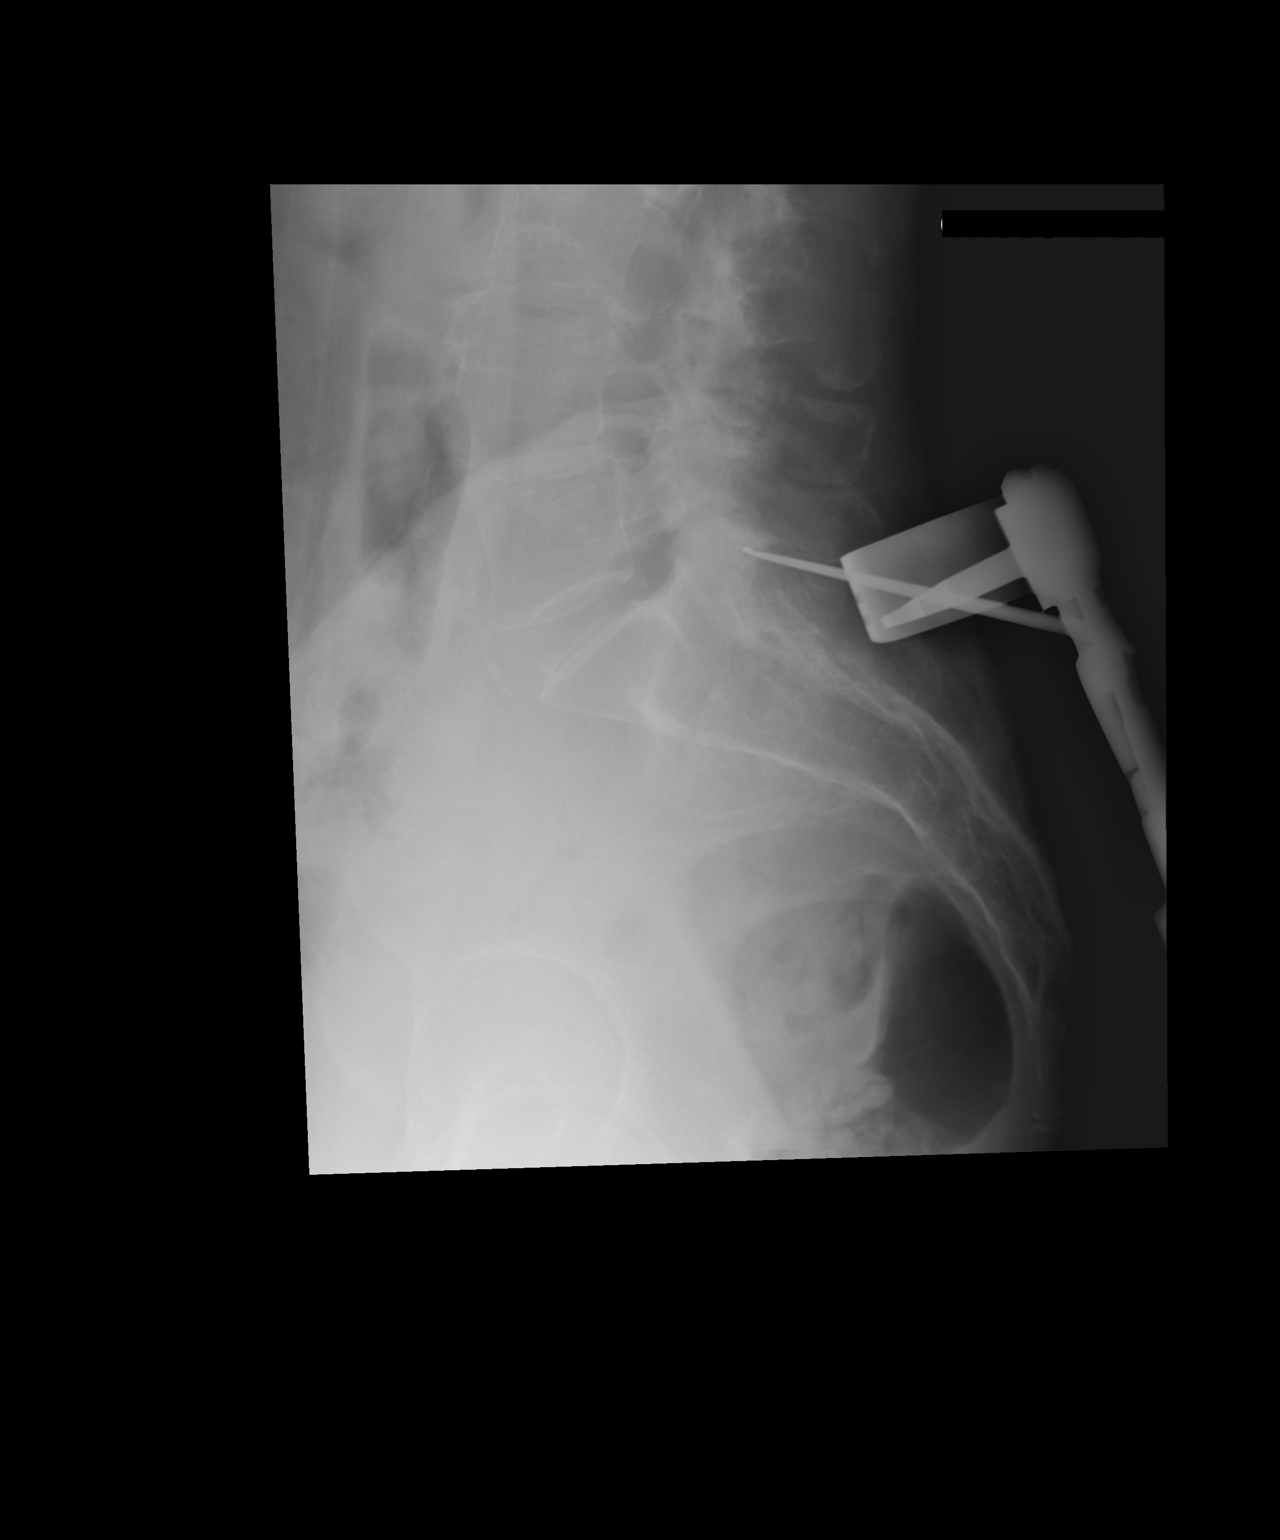

[1 of 1 positions shown; findings below may reference images not displayed]

FINDINGS: Surgical probe at L5-S1.
IMPRESSION: Lumbar localization as above.

## 2019-01-09 DIAGNOSIS — Z23 Encounter for immunization: Secondary | ICD-10-CM | POA: Diagnosis not present

## 2019-03-16 DIAGNOSIS — F339 Major depressive disorder, recurrent, unspecified: Secondary | ICD-10-CM | POA: Diagnosis not present

## 2019-03-16 DIAGNOSIS — I10 Essential (primary) hypertension: Secondary | ICD-10-CM | POA: Diagnosis not present

## 2019-03-16 DIAGNOSIS — M810 Age-related osteoporosis without current pathological fracture: Secondary | ICD-10-CM | POA: Diagnosis not present

## 2019-03-16 DIAGNOSIS — F3341 Major depressive disorder, recurrent, in partial remission: Secondary | ICD-10-CM | POA: Diagnosis not present

## 2019-03-16 DIAGNOSIS — E7800 Pure hypercholesterolemia, unspecified: Secondary | ICD-10-CM | POA: Diagnosis not present

## 2019-03-16 DIAGNOSIS — E782 Mixed hyperlipidemia: Secondary | ICD-10-CM | POA: Diagnosis not present

## 2019-03-16 DIAGNOSIS — F329 Major depressive disorder, single episode, unspecified: Secondary | ICD-10-CM | POA: Diagnosis not present

## 2019-05-21 DIAGNOSIS — M810 Age-related osteoporosis without current pathological fracture: Secondary | ICD-10-CM | POA: Diagnosis not present

## 2019-05-21 DIAGNOSIS — E7800 Pure hypercholesterolemia, unspecified: Secondary | ICD-10-CM | POA: Diagnosis not present

## 2019-05-21 DIAGNOSIS — I10 Essential (primary) hypertension: Secondary | ICD-10-CM | POA: Diagnosis not present

## 2019-05-21 DIAGNOSIS — F3341 Major depressive disorder, recurrent, in partial remission: Secondary | ICD-10-CM | POA: Diagnosis not present

## 2019-06-24 ENCOUNTER — Ambulatory Visit: Payer: Medicare Other | Attending: Internal Medicine

## 2019-07-19 DIAGNOSIS — I10 Essential (primary) hypertension: Secondary | ICD-10-CM | POA: Diagnosis not present

## 2019-07-19 DIAGNOSIS — F339 Major depressive disorder, recurrent, unspecified: Secondary | ICD-10-CM | POA: Diagnosis not present

## 2019-07-19 DIAGNOSIS — F3341 Major depressive disorder, recurrent, in partial remission: Secondary | ICD-10-CM | POA: Diagnosis not present

## 2019-07-19 DIAGNOSIS — M810 Age-related osteoporosis without current pathological fracture: Secondary | ICD-10-CM | POA: Diagnosis not present

## 2019-07-19 DIAGNOSIS — F329 Major depressive disorder, single episode, unspecified: Secondary | ICD-10-CM | POA: Diagnosis not present

## 2019-07-19 DIAGNOSIS — E7800 Pure hypercholesterolemia, unspecified: Secondary | ICD-10-CM | POA: Diagnosis not present

## 2019-07-19 DIAGNOSIS — E782 Mixed hyperlipidemia: Secondary | ICD-10-CM | POA: Diagnosis not present

## 2019-07-31 DIAGNOSIS — Z961 Presence of intraocular lens: Secondary | ICD-10-CM | POA: Diagnosis not present

## 2019-09-28 ENCOUNTER — Other Ambulatory Visit: Payer: Self-pay | Admitting: Internal Medicine

## 2019-09-28 DIAGNOSIS — M810 Age-related osteoporosis without current pathological fracture: Secondary | ICD-10-CM

## 2019-10-11 DIAGNOSIS — M810 Age-related osteoporosis without current pathological fracture: Secondary | ICD-10-CM | POA: Diagnosis not present

## 2019-10-11 DIAGNOSIS — F3341 Major depressive disorder, recurrent, in partial remission: Secondary | ICD-10-CM | POA: Diagnosis not present

## 2019-10-11 DIAGNOSIS — I10 Essential (primary) hypertension: Secondary | ICD-10-CM | POA: Diagnosis not present

## 2019-10-11 DIAGNOSIS — E782 Mixed hyperlipidemia: Secondary | ICD-10-CM | POA: Diagnosis not present

## 2019-10-11 DIAGNOSIS — E7800 Pure hypercholesterolemia, unspecified: Secondary | ICD-10-CM | POA: Diagnosis not present

## 2019-10-11 DIAGNOSIS — F329 Major depressive disorder, single episode, unspecified: Secondary | ICD-10-CM | POA: Diagnosis not present

## 2019-10-11 DIAGNOSIS — F339 Major depressive disorder, recurrent, unspecified: Secondary | ICD-10-CM | POA: Diagnosis not present

## 2019-10-23 DIAGNOSIS — I10 Essential (primary) hypertension: Secondary | ICD-10-CM | POA: Diagnosis not present

## 2019-10-23 DIAGNOSIS — F3341 Major depressive disorder, recurrent, in partial remission: Secondary | ICD-10-CM | POA: Diagnosis not present

## 2019-10-23 DIAGNOSIS — E7800 Pure hypercholesterolemia, unspecified: Secondary | ICD-10-CM | POA: Diagnosis not present

## 2019-10-23 DIAGNOSIS — Z0000 Encounter for general adult medical examination without abnormal findings: Secondary | ICD-10-CM | POA: Diagnosis not present

## 2019-10-23 DIAGNOSIS — Z23 Encounter for immunization: Secondary | ICD-10-CM | POA: Diagnosis not present

## 2019-10-23 DIAGNOSIS — F329 Major depressive disorder, single episode, unspecified: Secondary | ICD-10-CM | POA: Diagnosis not present

## 2019-10-23 DIAGNOSIS — M810 Age-related osteoporosis without current pathological fracture: Secondary | ICD-10-CM | POA: Diagnosis not present

## 2019-12-13 ENCOUNTER — Ambulatory Visit
Admission: RE | Admit: 2019-12-13 | Discharge: 2019-12-13 | Disposition: A | Discharge status: Home / Self Care | Payer: Medicare Other | Source: Ambulatory Visit | Attending: Internal Medicine | Admitting: Internal Medicine

## 2019-12-13 ENCOUNTER — Other Ambulatory Visit: Payer: Medicare Other

## 2019-12-13 ENCOUNTER — Other Ambulatory Visit: Payer: Self-pay

## 2019-12-13 DIAGNOSIS — M810 Age-related osteoporosis without current pathological fracture: Secondary | ICD-10-CM

## 2019-12-13 DIAGNOSIS — Z780 Asymptomatic menopausal state: Secondary | ICD-10-CM | POA: Diagnosis not present

## 2019-12-13 DIAGNOSIS — M85852 Other specified disorders of bone density and structure, left thigh: Secondary | ICD-10-CM | POA: Diagnosis not present

## 2019-12-31 DIAGNOSIS — Z86007 Personal history of in-situ neoplasm of skin: Secondary | ICD-10-CM | POA: Diagnosis not present

## 2019-12-31 DIAGNOSIS — L570 Actinic keratosis: Secondary | ICD-10-CM | POA: Diagnosis not present

## 2020-01-24 DIAGNOSIS — Z23 Encounter for immunization: Secondary | ICD-10-CM | POA: Diagnosis not present

## 2020-01-25 DIAGNOSIS — Z23 Encounter for immunization: Secondary | ICD-10-CM | POA: Diagnosis not present

## 2020-02-14 DIAGNOSIS — F3341 Major depressive disorder, recurrent, in partial remission: Secondary | ICD-10-CM | POA: Diagnosis not present

## 2020-02-14 DIAGNOSIS — E782 Mixed hyperlipidemia: Secondary | ICD-10-CM | POA: Diagnosis not present

## 2020-02-14 DIAGNOSIS — M810 Age-related osteoporosis without current pathological fracture: Secondary | ICD-10-CM | POA: Diagnosis not present

## 2020-02-14 DIAGNOSIS — E7800 Pure hypercholesterolemia, unspecified: Secondary | ICD-10-CM | POA: Diagnosis not present

## 2020-02-14 DIAGNOSIS — F339 Major depressive disorder, recurrent, unspecified: Secondary | ICD-10-CM | POA: Diagnosis not present

## 2020-02-14 DIAGNOSIS — F329 Major depressive disorder, single episode, unspecified: Secondary | ICD-10-CM | POA: Diagnosis not present

## 2020-02-14 DIAGNOSIS — I10 Essential (primary) hypertension: Secondary | ICD-10-CM | POA: Diagnosis not present

## 2020-04-23 DIAGNOSIS — F329 Major depressive disorder, single episode, unspecified: Secondary | ICD-10-CM | POA: Diagnosis not present

## 2020-04-23 DIAGNOSIS — Z23 Encounter for immunization: Secondary | ICD-10-CM | POA: Diagnosis not present

## 2020-04-23 DIAGNOSIS — M810 Age-related osteoporosis without current pathological fracture: Secondary | ICD-10-CM | POA: Diagnosis not present

## 2020-04-23 DIAGNOSIS — I10 Essential (primary) hypertension: Secondary | ICD-10-CM | POA: Diagnosis not present

## 2020-04-23 DIAGNOSIS — E7800 Pure hypercholesterolemia, unspecified: Secondary | ICD-10-CM | POA: Diagnosis not present

## 2020-05-12 DIAGNOSIS — M810 Age-related osteoporosis without current pathological fracture: Secondary | ICD-10-CM | POA: Diagnosis not present

## 2020-05-12 DIAGNOSIS — F3341 Major depressive disorder, recurrent, in partial remission: Secondary | ICD-10-CM | POA: Diagnosis not present

## 2020-05-12 DIAGNOSIS — E782 Mixed hyperlipidemia: Secondary | ICD-10-CM | POA: Diagnosis not present

## 2020-05-12 DIAGNOSIS — I10 Essential (primary) hypertension: Secondary | ICD-10-CM | POA: Diagnosis not present

## 2020-05-12 DIAGNOSIS — F329 Major depressive disorder, single episode, unspecified: Secondary | ICD-10-CM | POA: Diagnosis not present

## 2020-05-12 DIAGNOSIS — F339 Major depressive disorder, recurrent, unspecified: Secondary | ICD-10-CM | POA: Diagnosis not present

## 2020-05-12 DIAGNOSIS — E7800 Pure hypercholesterolemia, unspecified: Secondary | ICD-10-CM | POA: Diagnosis not present

## 2020-07-30 DIAGNOSIS — Z961 Presence of intraocular lens: Secondary | ICD-10-CM | POA: Diagnosis not present

## 2020-08-08 DIAGNOSIS — F3341 Major depressive disorder, recurrent, in partial remission: Secondary | ICD-10-CM | POA: Diagnosis not present

## 2020-08-08 DIAGNOSIS — I10 Essential (primary) hypertension: Secondary | ICD-10-CM | POA: Diagnosis not present

## 2020-08-08 DIAGNOSIS — E782 Mixed hyperlipidemia: Secondary | ICD-10-CM | POA: Diagnosis not present

## 2020-08-08 DIAGNOSIS — E7800 Pure hypercholesterolemia, unspecified: Secondary | ICD-10-CM | POA: Diagnosis not present

## 2020-08-08 DIAGNOSIS — M810 Age-related osteoporosis without current pathological fracture: Secondary | ICD-10-CM | POA: Diagnosis not present

## 2020-08-13 DIAGNOSIS — Z23 Encounter for immunization: Secondary | ICD-10-CM | POA: Diagnosis not present

## 2020-10-30 DIAGNOSIS — M810 Age-related osteoporosis without current pathological fracture: Secondary | ICD-10-CM | POA: Diagnosis not present

## 2020-10-30 DIAGNOSIS — E7800 Pure hypercholesterolemia, unspecified: Secondary | ICD-10-CM | POA: Diagnosis not present

## 2020-10-30 DIAGNOSIS — I10 Essential (primary) hypertension: Secondary | ICD-10-CM | POA: Diagnosis not present

## 2020-10-30 DIAGNOSIS — F3341 Major depressive disorder, recurrent, in partial remission: Secondary | ICD-10-CM | POA: Diagnosis not present

## 2020-10-30 DIAGNOSIS — Z5181 Encounter for therapeutic drug level monitoring: Secondary | ICD-10-CM | POA: Diagnosis not present

## 2020-11-13 DIAGNOSIS — E039 Hypothyroidism, unspecified: Secondary | ICD-10-CM | POA: Diagnosis not present

## 2021-01-14 DIAGNOSIS — E039 Hypothyroidism, unspecified: Secondary | ICD-10-CM | POA: Diagnosis not present

## 2021-01-23 DIAGNOSIS — Z23 Encounter for immunization: Secondary | ICD-10-CM | POA: Diagnosis not present

## 2021-02-05 DIAGNOSIS — E7800 Pure hypercholesterolemia, unspecified: Secondary | ICD-10-CM | POA: Diagnosis not present

## 2021-02-05 DIAGNOSIS — I10 Essential (primary) hypertension: Secondary | ICD-10-CM | POA: Diagnosis not present

## 2021-02-05 DIAGNOSIS — E782 Mixed hyperlipidemia: Secondary | ICD-10-CM | POA: Diagnosis not present

## 2021-02-05 DIAGNOSIS — F3341 Major depressive disorder, recurrent, in partial remission: Secondary | ICD-10-CM | POA: Diagnosis not present

## 2021-02-05 DIAGNOSIS — E039 Hypothyroidism, unspecified: Secondary | ICD-10-CM | POA: Diagnosis not present

## 2021-02-05 DIAGNOSIS — M810 Age-related osteoporosis without current pathological fracture: Secondary | ICD-10-CM | POA: Diagnosis not present

## 2021-04-24 DIAGNOSIS — M810 Age-related osteoporosis without current pathological fracture: Secondary | ICD-10-CM | POA: Diagnosis not present

## 2021-04-24 DIAGNOSIS — E7800 Pure hypercholesterolemia, unspecified: Secondary | ICD-10-CM | POA: Diagnosis not present

## 2021-04-24 DIAGNOSIS — I10 Essential (primary) hypertension: Secondary | ICD-10-CM | POA: Diagnosis not present

## 2021-04-24 DIAGNOSIS — E039 Hypothyroidism, unspecified: Secondary | ICD-10-CM | POA: Diagnosis not present

## 2021-04-24 DIAGNOSIS — F3341 Major depressive disorder, recurrent, in partial remission: Secondary | ICD-10-CM | POA: Diagnosis not present

## 2021-05-01 DIAGNOSIS — F3341 Major depressive disorder, recurrent, in partial remission: Secondary | ICD-10-CM | POA: Diagnosis not present

## 2021-05-01 DIAGNOSIS — E7800 Pure hypercholesterolemia, unspecified: Secondary | ICD-10-CM | POA: Diagnosis not present

## 2021-05-01 DIAGNOSIS — M810 Age-related osteoporosis without current pathological fracture: Secondary | ICD-10-CM | POA: Diagnosis not present

## 2021-05-01 DIAGNOSIS — R413 Other amnesia: Secondary | ICD-10-CM | POA: Diagnosis not present

## 2021-05-01 DIAGNOSIS — I10 Essential (primary) hypertension: Secondary | ICD-10-CM | POA: Diagnosis not present

## 2021-05-01 DIAGNOSIS — Z0000 Encounter for general adult medical examination without abnormal findings: Secondary | ICD-10-CM | POA: Diagnosis not present

## 2021-05-01 DIAGNOSIS — E039 Hypothyroidism, unspecified: Secondary | ICD-10-CM | POA: Diagnosis not present

## 2021-06-02 DIAGNOSIS — E039 Hypothyroidism, unspecified: Secondary | ICD-10-CM | POA: Diagnosis not present

## 2021-08-03 DIAGNOSIS — Z961 Presence of intraocular lens: Secondary | ICD-10-CM | POA: Diagnosis not present

## 2021-10-28 DIAGNOSIS — R946 Abnormal results of thyroid function studies: Secondary | ICD-10-CM | POA: Diagnosis not present

## 2021-10-28 DIAGNOSIS — E7800 Pure hypercholesterolemia, unspecified: Secondary | ICD-10-CM | POA: Diagnosis not present

## 2021-10-28 DIAGNOSIS — M810 Age-related osteoporosis without current pathological fracture: Secondary | ICD-10-CM | POA: Diagnosis not present

## 2021-10-28 DIAGNOSIS — I10 Essential (primary) hypertension: Secondary | ICD-10-CM | POA: Diagnosis not present

## 2021-10-28 DIAGNOSIS — F3341 Major depressive disorder, recurrent, in partial remission: Secondary | ICD-10-CM | POA: Diagnosis not present

## 2021-10-28 DIAGNOSIS — R413 Other amnesia: Secondary | ICD-10-CM | POA: Diagnosis not present

## 2021-10-28 DIAGNOSIS — R7989 Other specified abnormal findings of blood chemistry: Secondary | ICD-10-CM | POA: Diagnosis not present

## 2021-12-10 DIAGNOSIS — W5503XA Scratched by cat, initial encounter: Secondary | ICD-10-CM | POA: Diagnosis not present

## 2021-12-10 DIAGNOSIS — R946 Abnormal results of thyroid function studies: Secondary | ICD-10-CM | POA: Diagnosis not present

## 2021-12-10 DIAGNOSIS — R7989 Other specified abnormal findings of blood chemistry: Secondary | ICD-10-CM | POA: Diagnosis not present

## 2021-12-10 DIAGNOSIS — S71112D Laceration without foreign body, left thigh, subsequent encounter: Secondary | ICD-10-CM | POA: Diagnosis not present

## 2022-01-19 DIAGNOSIS — R946 Abnormal results of thyroid function studies: Secondary | ICD-10-CM | POA: Diagnosis not present

## 2022-01-23 DIAGNOSIS — Z23 Encounter for immunization: Secondary | ICD-10-CM | POA: Diagnosis not present

## 2022-02-12 DIAGNOSIS — Z23 Encounter for immunization: Secondary | ICD-10-CM | POA: Diagnosis not present

## 2022-04-28 DIAGNOSIS — Z1331 Encounter for screening for depression: Secondary | ICD-10-CM | POA: Diagnosis not present

## 2022-04-28 DIAGNOSIS — M810 Age-related osteoporosis without current pathological fracture: Secondary | ICD-10-CM | POA: Diagnosis not present

## 2022-04-28 DIAGNOSIS — F3341 Major depressive disorder, recurrent, in partial remission: Secondary | ICD-10-CM | POA: Diagnosis not present

## 2022-04-28 DIAGNOSIS — Z5181 Encounter for therapeutic drug level monitoring: Secondary | ICD-10-CM | POA: Diagnosis not present

## 2022-04-28 DIAGNOSIS — Z0000 Encounter for general adult medical examination without abnormal findings: Secondary | ICD-10-CM | POA: Diagnosis not present

## 2022-04-28 DIAGNOSIS — E7800 Pure hypercholesterolemia, unspecified: Secondary | ICD-10-CM | POA: Diagnosis not present

## 2022-04-28 DIAGNOSIS — I10 Essential (primary) hypertension: Secondary | ICD-10-CM | POA: Diagnosis not present

## 2022-04-29 ENCOUNTER — Other Ambulatory Visit: Payer: Self-pay | Admitting: Internal Medicine

## 2022-04-29 DIAGNOSIS — M810 Age-related osteoporosis without current pathological fracture: Secondary | ICD-10-CM

## 2022-06-04 DIAGNOSIS — R399 Unspecified symptoms and signs involving the genitourinary system: Secondary | ICD-10-CM | POA: Diagnosis not present

## 2022-08-04 DIAGNOSIS — Z961 Presence of intraocular lens: Secondary | ICD-10-CM | POA: Diagnosis not present

## 2022-10-29 DIAGNOSIS — E7800 Pure hypercholesterolemia, unspecified: Secondary | ICD-10-CM | POA: Diagnosis not present

## 2022-10-29 DIAGNOSIS — F3341 Major depressive disorder, recurrent, in partial remission: Secondary | ICD-10-CM | POA: Diagnosis not present

## 2022-10-29 DIAGNOSIS — I10 Essential (primary) hypertension: Secondary | ICD-10-CM | POA: Diagnosis not present

## 2022-10-29 DIAGNOSIS — M810 Age-related osteoporosis without current pathological fracture: Secondary | ICD-10-CM | POA: Diagnosis not present

## 2022-11-03 ENCOUNTER — Ambulatory Visit
Admission: RE | Admit: 2022-11-03 | Discharge: 2022-11-03 | Disposition: A | Discharge status: Home / Self Care | Payer: Medicare Other | Source: Ambulatory Visit | Attending: Internal Medicine | Admitting: Internal Medicine

## 2022-11-03 DIAGNOSIS — M810 Age-related osteoporosis without current pathological fracture: Secondary | ICD-10-CM

## 2022-11-03 DIAGNOSIS — Z90722 Acquired absence of ovaries, bilateral: Secondary | ICD-10-CM | POA: Diagnosis not present

## 2022-11-03 DIAGNOSIS — E349 Endocrine disorder, unspecified: Secondary | ICD-10-CM | POA: Diagnosis not present

## 2023-01-21 DIAGNOSIS — Z23 Encounter for immunization: Secondary | ICD-10-CM | POA: Diagnosis not present

## 2023-05-05 DIAGNOSIS — Z0000 Encounter for general adult medical examination without abnormal findings: Secondary | ICD-10-CM | POA: Diagnosis not present

## 2023-05-05 DIAGNOSIS — E7800 Pure hypercholesterolemia, unspecified: Secondary | ICD-10-CM | POA: Diagnosis not present

## 2023-05-05 DIAGNOSIS — M810 Age-related osteoporosis without current pathological fracture: Secondary | ICD-10-CM | POA: Diagnosis not present

## 2023-05-05 DIAGNOSIS — F3341 Major depressive disorder, recurrent, in partial remission: Secondary | ICD-10-CM | POA: Diagnosis not present

## 2023-05-05 DIAGNOSIS — I10 Essential (primary) hypertension: Secondary | ICD-10-CM | POA: Diagnosis not present

## 2023-05-05 DIAGNOSIS — Z85820 Personal history of malignant melanoma of skin: Secondary | ICD-10-CM | POA: Diagnosis not present

## 2023-05-05 DIAGNOSIS — R5383 Other fatigue: Secondary | ICD-10-CM | POA: Diagnosis not present

## 2023-05-05 DIAGNOSIS — Z23 Encounter for immunization: Secondary | ICD-10-CM | POA: Diagnosis not present

## 2023-08-29 DIAGNOSIS — M25559 Pain in unspecified hip: Secondary | ICD-10-CM | POA: Diagnosis not present

## 2023-08-29 DIAGNOSIS — R310 Gross hematuria: Secondary | ICD-10-CM | POA: Diagnosis not present

## 2023-11-03 DIAGNOSIS — U071 COVID-19: Secondary | ICD-10-CM | POA: Diagnosis not present

## 2023-12-23 DIAGNOSIS — S0502XA Injury of conjunctiva and corneal abrasion without foreign body, left eye, initial encounter: Secondary | ICD-10-CM | POA: Diagnosis not present

## 2024-01-01 DIAGNOSIS — M7582 Other shoulder lesions, left shoulder: Secondary | ICD-10-CM | POA: Diagnosis not present

## 2024-01-01 DIAGNOSIS — M25462 Effusion, left knee: Secondary | ICD-10-CM | POA: Diagnosis not present

## 2024-01-10 DIAGNOSIS — M816 Localized osteoporosis [Lequesne]: Secondary | ICD-10-CM | POA: Diagnosis not present

## 2024-01-10 DIAGNOSIS — I499 Cardiac arrhythmia, unspecified: Secondary | ICD-10-CM | POA: Diagnosis not present

## 2024-01-10 DIAGNOSIS — Z1330 Encounter for screening examination for mental health and behavioral disorders, unspecified: Secondary | ICD-10-CM | POA: Diagnosis not present

## 2024-01-10 DIAGNOSIS — I10 Essential (primary) hypertension: Secondary | ICD-10-CM | POA: Diagnosis not present

## 2024-01-10 DIAGNOSIS — F331 Major depressive disorder, recurrent, moderate: Secondary | ICD-10-CM | POA: Diagnosis not present

## 2024-02-14 DIAGNOSIS — Z23 Encounter for immunization: Secondary | ICD-10-CM | POA: Diagnosis not present

## 2024-02-21 DIAGNOSIS — E782 Mixed hyperlipidemia: Secondary | ICD-10-CM | POA: Diagnosis not present

## 2024-02-21 DIAGNOSIS — E559 Vitamin D deficiency, unspecified: Secondary | ICD-10-CM | POA: Diagnosis not present

## 2024-02-21 DIAGNOSIS — I10 Essential (primary) hypertension: Secondary | ICD-10-CM | POA: Diagnosis not present

## 2024-02-21 DIAGNOSIS — E039 Hypothyroidism, unspecified: Secondary | ICD-10-CM | POA: Diagnosis not present

## 2024-04-15 DIAGNOSIS — Z23 Encounter for immunization: Secondary | ICD-10-CM | POA: Diagnosis not present
